# Patient Record
Sex: Male | Born: 1960
Health system: Southern US, Community
[De-identification: ages and names within clinical notes are randomized; demographics above are authoritative.]

## PROBLEM LIST (undated history)

## (undated) DIAGNOSIS — F329 Major depressive disorder, single episode, unspecified: Secondary | ICD-10-CM

## (undated) DIAGNOSIS — K219 Gastro-esophageal reflux disease without esophagitis: Secondary | ICD-10-CM

## (undated) DIAGNOSIS — Z9884 Bariatric surgery status: Secondary | ICD-10-CM

## (undated) DIAGNOSIS — R0602 Shortness of breath: Secondary | ICD-10-CM

## (undated) DIAGNOSIS — R7303 Prediabetes: Secondary | ICD-10-CM

## (undated) DIAGNOSIS — F32A Depression, unspecified: Secondary | ICD-10-CM

## (undated) DIAGNOSIS — M1990 Unspecified osteoarthritis, unspecified site: Secondary | ICD-10-CM

## (undated) DIAGNOSIS — F419 Anxiety disorder, unspecified: Secondary | ICD-10-CM

## (undated) DIAGNOSIS — M545 Low back pain, unspecified: Secondary | ICD-10-CM

## (undated) DIAGNOSIS — N189 Chronic kidney disease, unspecified: Secondary | ICD-10-CM

## (undated) DIAGNOSIS — E7800 Pure hypercholesterolemia, unspecified: Secondary | ICD-10-CM

## (undated) DIAGNOSIS — M2550 Pain in unspecified joint: Secondary | ICD-10-CM

## (undated) DIAGNOSIS — J189 Pneumonia, unspecified organism: Secondary | ICD-10-CM

## (undated) DIAGNOSIS — J45909 Unspecified asthma, uncomplicated: Secondary | ICD-10-CM

## (undated) DIAGNOSIS — M109 Gout, unspecified: Secondary | ICD-10-CM

## (undated) DIAGNOSIS — I517 Cardiomegaly: Secondary | ICD-10-CM

## (undated) DIAGNOSIS — M7989 Other specified soft tissue disorders: Secondary | ICD-10-CM

## (undated) DIAGNOSIS — G4730 Sleep apnea, unspecified: Secondary | ICD-10-CM

## (undated) DIAGNOSIS — Z22322 Carrier or suspected carrier of Methicillin resistant Staphylococcus aureus: Secondary | ICD-10-CM

## (undated) DIAGNOSIS — I10 Essential (primary) hypertension: Secondary | ICD-10-CM

## (undated) HISTORY — DX: Shortness of breath: R06.02

## (undated) HISTORY — DX: Major depressive disorder, single episode, unspecified: F32.9

## (undated) HISTORY — DX: Pain in unspecified joint: M25.50

## (undated) HISTORY — DX: Depression, unspecified: F32.A

## (undated) HISTORY — DX: Pure hypercholesterolemia, unspecified: E78.00

## (undated) HISTORY — DX: Anxiety disorder, unspecified: F41.9

## (undated) HISTORY — DX: Unspecified asthma, uncomplicated: J45.909

## (undated) HISTORY — DX: Other specified soft tissue disorders: M79.89

---

## 2006-12-12 HISTORY — PX: HERNIA REPAIR: SHX51

## 2007-06-25 ENCOUNTER — Ambulatory Visit (HOSPITAL_BASED_OUTPATIENT_CLINIC_OR_DEPARTMENT_OTHER): Admission: RE | Admit: 2007-06-25 | Discharge: 2007-06-25 | Payer: Self-pay | Admitting: General Surgery

## 2008-12-12 HISTORY — PX: GASTRIC BYPASS OPEN: SUR638

## 2011-04-26 NOTE — Op Note (Signed)
NAME:  WAI, LITT               ACCOUNT NO.:  [id number]   MEDICAL RECORD NO.:  [id number]          PATIENT TYPE:  AMB   LOCATION:  DSC                          FACILITY:  [facility]   PHYSICIAN:  Gabrielle Dare. Janee Morn, M.D.DATE OF BIRTH:  1961/11/29   DATE OF PROCEDURE:  06/25/2007  DATE OF DISCHARGE:                               OPERATIVE REPORT   PREOPERATIVE DIAGNOSIS:  Umbilical hernia.   POSTOPERATIVE DIAGNOSIS:  Umbilical hernia.   PROCEDURE:  Repair umbilical hernia with mesh.   SURGEON:  Violeta Gelinas, M.D.   ANESTHESIA:  General.   HISTORY OF PRESENT ILLNESS:  Mr. Straus is a 50 year old African  American gentleman who I evaluated in the office for a symptomatic  umbilical hernia.  He presents today for elective repair.   PROCEDURE IN DETAIL:  Informed consent was obtained.  The patient's site  was marked.  He received intravenous antibiotics.  He was brought to the  operating room.  General anesthesia was administered by the anesthesia  staff.  His abdomen was prepped and draped in sterile fashion.  Infraumbilical region was infiltrated with 0.25% Marcaine with  epinephrine.  A curvilinear infraumbilical incision was made.  Subcutaneous tissues were dissected down.  The umbilicus was  circumferentially dissected and the umbilical skin was lifted off the  underlying fascia and hernia sac without damaging the skin.  The hernia  sac contained only omentum.  It was circumferentially dissected away  from the fascia. It was reduced back into the abdomen without  difficulty.  The fascia was cleaned off circumferentially.  The hernia  repair was then completed with a oval polypropylene mesh.  This was  fashioned to allow greater than 1 cm circumferentially of overlay. The  defect itself was about 1 cm across.  This was sutured in with  interrupted 0-0 Prolene sutures in an inlay fashion.  Two more  additional 0-0 Prolenes were then placed between the fascia and the mesh  to  secure it.  This completed the repair.  The area was copiously  irrigated.  Some additional local anesthetic was injected.  The  umbilical skin was then tacked back down to the fascia with interrupted  2-0 Vicryl sutures.  Subcutaneous tissues were closed with running 3-0  Vicryl suture.  The skin was closed with running 4-0 Monocryl  subcuticular stitch after insuring excellent hemostasis.  Sponge, needle  and instrument counts were correct.  Benzoin, Steri-Strips and sterile  cotton ball and a sterile dressing were applied.  The patient tolerated  the procedure well without apparent complication, was taken to recovery  room in stable condition.      Gabrielle Dare Janee Morn, M.D.  Electronically Signed    BET/MEDQ  D:  06/25/2007  T:  06/26/2007  Job:  [id number]   cc:   Jethro Bastos, M.D.

## 2011-09-27 LAB — BASIC METABOLIC PANEL
BUN: 14
CO2: 28
Chloride: 101
Creatinine, Ser: 1.23
GFR calc Af Amer: >60
Potassium: 3.7

## 2011-09-27 LAB — POCT HEMOGLOBIN-HEMACUE
Hemoglobin: 14
Operator id: 112821

## 2012-05-12 HISTORY — PX: KNEE RECONSTRUCTION: SHX5883

## 2012-05-12 HISTORY — PX: ACHILLES TENDON SURGERY: SHX542

## 2013-08-15 ENCOUNTER — Encounter (HOSPITAL_BASED_OUTPATIENT_CLINIC_OR_DEPARTMENT_OTHER): Payer: Self-pay | Admitting: *Deleted

## 2013-08-15 NOTE — Progress Notes (Signed)
Pt had a hernia surgery here 08-has been working [employer] surgery there-had gastric bypass-lost 100lb-used to have sleep apnea-does still snore but no apnea To get ekg and bmet

## 2013-08-20 ENCOUNTER — Encounter (HOSPITAL_BASED_OUTPATIENT_CLINIC_OR_DEPARTMENT_OTHER)
Admission: RE | Admit: 2013-08-20 | Discharge: 2013-08-20 | Disposition: A | Discharge status: Home / Self Care | Payer: Worker's Compensation | Source: Ambulatory Visit | Attending: Orthopedic Surgery | Admitting: Orthopedic Surgery

## 2013-08-20 ENCOUNTER — Other Ambulatory Visit: Payer: Self-pay

## 2013-08-20 DIAGNOSIS — Z01812 Encounter for preprocedural laboratory examination: Secondary | ICD-10-CM | POA: Insufficient documentation

## 2013-08-20 DIAGNOSIS — Z01810 Encounter for preprocedural cardiovascular examination: Secondary | ICD-10-CM | POA: Insufficient documentation

## 2013-08-20 DIAGNOSIS — Z01818 Encounter for other preprocedural examination: Secondary | ICD-10-CM | POA: Insufficient documentation

## 2013-08-20 LAB — BASIC METABOLIC PANEL
CO2: 25 mEq/L (ref 19–32)
Chloride: 103 mEq/L (ref 96–112)
Creatinine, Ser: 1.15 mg/dL (ref 0.50–1.35)
GFR calc Af Amer: 85 mL/min — ABNORMAL LOW (ref >90)
Potassium: 3.8 mEq/L (ref 3.5–5.1)

## 2013-08-21 ENCOUNTER — Other Ambulatory Visit: Payer: Self-pay | Admitting: Orthopedic Surgery

## 2013-08-22 ENCOUNTER — Encounter (HOSPITAL_BASED_OUTPATIENT_CLINIC_OR_DEPARTMENT_OTHER): Payer: Self-pay | Admitting: Certified Registered Nurse Anesthetist

## 2013-08-22 ENCOUNTER — Ambulatory Visit (HOSPITAL_BASED_OUTPATIENT_CLINIC_OR_DEPARTMENT_OTHER): Payer: Worker's Compensation | Admitting: Certified Registered Nurse Anesthetist

## 2013-08-22 ENCOUNTER — Ambulatory Visit (HOSPITAL_BASED_OUTPATIENT_CLINIC_OR_DEPARTMENT_OTHER)
Admission: RE | Admit: 2013-08-22 | Discharge: 2013-08-23 | Disposition: A | Discharge status: Home / Self Care | Payer: Worker's Compensation | Source: Ambulatory Visit | Attending: Orthopedic Surgery | Admitting: Orthopedic Surgery

## 2013-08-22 ENCOUNTER — Ambulatory Visit (HOSPITAL_COMMUNITY): Payer: Worker's Compensation

## 2013-08-22 ENCOUNTER — Encounter (HOSPITAL_BASED_OUTPATIENT_CLINIC_OR_DEPARTMENT_OTHER)
Admission: RE | Disposition: A | Discharge status: Home / Self Care | Payer: Self-pay | Source: Ambulatory Visit | Attending: Orthopedic Surgery

## 2013-08-22 ENCOUNTER — Encounter (HOSPITAL_BASED_OUTPATIENT_CLINIC_OR_DEPARTMENT_OTHER): Payer: Self-pay | Admitting: *Deleted

## 2013-08-22 DIAGNOSIS — Z01818 Encounter for other preprocedural examination: Secondary | ICD-10-CM | POA: Insufficient documentation

## 2013-08-22 DIAGNOSIS — M25569 Pain in unspecified knee: Secondary | ICD-10-CM | POA: Insufficient documentation

## 2013-08-22 DIAGNOSIS — T8489XA Other specified complication of internal orthopedic prosthetic devices, implants and grafts, initial encounter: Secondary | ICD-10-CM | POA: Insufficient documentation

## 2013-08-22 DIAGNOSIS — M7661 Achilles tendinitis, right leg: Secondary | ICD-10-CM

## 2013-08-22 DIAGNOSIS — I10 Essential (primary) hypertension: Secondary | ICD-10-CM | POA: Insufficient documentation

## 2013-08-22 DIAGNOSIS — Z01812 Encounter for preprocedural laboratory examination: Secondary | ICD-10-CM | POA: Insufficient documentation

## 2013-08-22 DIAGNOSIS — K219 Gastro-esophageal reflux disease without esophagitis: Secondary | ICD-10-CM | POA: Insufficient documentation

## 2013-08-22 DIAGNOSIS — M6240 Contracture of muscle, unspecified site: Secondary | ICD-10-CM | POA: Insufficient documentation

## 2013-08-22 DIAGNOSIS — Z01810 Encounter for preprocedural cardiovascular examination: Secondary | ICD-10-CM | POA: Insufficient documentation

## 2013-08-22 DIAGNOSIS — M928 Other specified juvenile osteochondrosis: Secondary | ICD-10-CM | POA: Insufficient documentation

## 2013-08-22 DIAGNOSIS — M25579 Pain in unspecified ankle and joints of unspecified foot: Secondary | ICD-10-CM | POA: Insufficient documentation

## 2013-08-22 DIAGNOSIS — Y831 Surgical operation with implant of artificial internal device as the cause of abnormal reaction of the patient, or of later complication, without mention of misadventure at the time of the procedure: Secondary | ICD-10-CM | POA: Insufficient documentation

## 2013-08-22 DIAGNOSIS — S93499A Sprain of other ligament of unspecified ankle, initial encounter: Secondary | ICD-10-CM | POA: Insufficient documentation

## 2013-08-22 HISTORY — DX: Gastro-esophageal reflux disease without esophagitis: K21.9

## 2013-08-22 HISTORY — DX: Carrier or suspected carrier of methicillin resistant Staphylococcus aureus: Z22.322

## 2013-08-22 HISTORY — DX: Sleep apnea, unspecified: G47.30

## 2013-08-22 HISTORY — DX: Bariatric surgery status: Z98.84

## 2013-08-22 HISTORY — PX: EXCISION HAGLUND'S DEFORMITY WITH ACHILLES TENDON REPAIR: SHX5627

## 2013-08-22 HISTORY — DX: Essential (primary) hypertension: I10

## 2013-08-22 HISTORY — PX: HARDWARE REMOVAL: SHX979

## 2013-08-22 LAB — POCT HEMOGLOBIN-HEMACUE: Hemoglobin: 13.9 g/dL (ref 13.0–17.0)

## 2013-08-22 SURGERY — REMOVAL, HARDWARE
Anesthesia: Regional | Site: Knee | Laterality: Right | Wound class: Clean

## 2013-08-22 MED ORDER — OXYCODONE HCL 5 MG PO TABS
5.0000 mg | ORAL_TABLET | ORAL | Status: DC | PRN
  Administered 2013-08-22: 10 mg via ORAL
  Administered 2013-08-22: 5 mg via ORAL
  Administered 2013-08-22 – 2013-08-23 (×3): 15 mg via ORAL

## 2013-08-22 MED ORDER — MUPIROCIN CALCIUM 2 % EX CREA: 1.0000 "application " | TOPICAL_CREAM | Freq: Two times a day (BID) | CUTANEOUS | Status: DC

## 2013-08-22 MED ORDER — VANCOMYCIN HCL IN DEXTROSE 1-5 GM/200ML-% IV SOLN
1000.0000 mg | Freq: Two times a day (BID) | INTRAVENOUS | Status: AC
  Administered 2013-08-22: 1000 mg via INTRAVENOUS

## 2013-08-22 MED ORDER — MIDAZOLAM HCL 2 MG/2ML IJ SOLN
1.0000 mg | INTRAMUSCULAR | Status: DC | PRN
  Administered 2013-08-22: 2 mg via INTRAVENOUS
  Administered 2013-08-22: 1 mg via INTRAVENOUS

## 2013-08-22 MED ORDER — HYDROMORPHONE HCL PF 1 MG/ML IJ SOLN
0.2500 mg | INTRAMUSCULAR | Status: DC | PRN
  Administered 2013-08-22: 0.5 mg via INTRAVENOUS

## 2013-08-22 MED ORDER — FENTANYL CITRATE 0.05 MG/ML IJ SOLN
INTRAMUSCULAR | Status: DC | PRN
  Administered 2013-08-22: 50 ug via INTRAVENOUS
  Administered 2013-08-22: 25 ug via INTRAVENOUS
  Administered 2013-08-22: 50 ug via INTRAVENOUS

## 2013-08-22 MED ORDER — VANCOMYCIN HCL 10 G IV SOLR
1500.0000 mg | INTRAVENOUS | Status: AC
  Administered 2013-08-22: 1500 mg via INTRAVENOUS

## 2013-08-22 MED ORDER — BUPIVACAINE HCL (PF) 0.5 % IJ SOLN
INTRAMUSCULAR | Status: DC | PRN
  Administered 2013-08-22: 20 mL

## 2013-08-22 MED ORDER — AMLODIPINE BESYLATE 10 MG PO TABS: 10.0000 mg | ORAL_TABLET | Freq: Every day | ORAL | Status: DC

## 2013-08-22 MED ORDER — SENNOSIDES 8.6 MG PO TABS: 2.0000 | ORAL_TABLET | Freq: Every day | ORAL | Status: DC

## 2013-08-22 MED ORDER — CHLORHEXIDINE GLUCONATE 4 % EX LIQD: 60.0000 mL | Freq: Once | CUTANEOUS | Status: DC

## 2013-08-22 MED ORDER — ONDANSETRON HCL 4 MG PO TABS: 4.0000 mg | ORAL_TABLET | Freq: Four times a day (QID) | ORAL | Status: DC | PRN

## 2013-08-22 MED ORDER — SODIUM CHLORIDE 0.9 % IV SOLN
INTRAVENOUS | Status: DC
  Administered 2013-08-22: 150 mL/h via INTRAVENOUS
  Administered 2013-08-22: 20 mL/h via INTRAVENOUS

## 2013-08-22 MED ORDER — ENOXAPARIN SODIUM 40 MG/0.4ML ~~LOC~~ SOLN
40.0000 mg | SUBCUTANEOUS | Status: DC
  Administered 2013-08-23: 40 mg via SUBCUTANEOUS

## 2013-08-22 MED ORDER — BACITRACIN ZINC 500 UNIT/GM EX OINT
TOPICAL_OINTMENT | CUTANEOUS | Status: DC | PRN
  Administered 2013-08-22: 1 via TOPICAL

## 2013-08-22 MED ORDER — ONDANSETRON HCL 4 MG/2ML IJ SOLN
INTRAMUSCULAR | Status: DC | PRN
  Administered 2013-08-22: 4 mg via INTRAVENOUS

## 2013-08-22 MED ORDER — SUCCINYLCHOLINE CHLORIDE 20 MG/ML IJ SOLN
INTRAMUSCULAR | Status: DC | PRN
  Administered 2013-08-22: 100 mg via INTRAVENOUS

## 2013-08-22 MED ORDER — MIDAZOLAM HCL 5 MG/5ML IJ SOLN
INTRAMUSCULAR | Status: DC | PRN
  Administered 2013-08-22: 1 mg via INTRAVENOUS

## 2013-08-22 MED ORDER — DOCUSATE SODIUM 100 MG PO CAPS: 100.0000 mg | ORAL_CAPSULE | Freq: Two times a day (BID) | ORAL | Status: DC

## 2013-08-22 MED ORDER — SENNA 8.6 MG PO TABS: 2.0000 | ORAL_TABLET | Freq: Two times a day (BID) | ORAL | Status: DC

## 2013-08-22 MED ORDER — BUPIVACAINE-EPINEPHRINE 0.5% -1:200000 IJ SOLN
INTRAMUSCULAR | Status: DC | PRN
  Administered 2013-08-22: 10 mL

## 2013-08-22 MED ORDER — LACTATED RINGERS IV SOLN
INTRAVENOUS | Status: DC
  Administered 2013-08-22 (×2): via INTRAVENOUS

## 2013-08-22 MED ORDER — ASPIRIN EC 325 MG PO TBEC: 325.0000 mg | DELAYED_RELEASE_TABLET | Freq: Every day | ORAL | Status: DC

## 2013-08-22 MED ORDER — FENTANYL CITRATE 0.05 MG/ML IJ SOLN
50.0000 ug | INTRAMUSCULAR | Status: DC | PRN
  Administered 2013-08-22: 100 ug via INTRAVENOUS
  Administered 2013-08-22: 50 ug via INTRAVENOUS

## 2013-08-22 MED ORDER — OXYCODONE HCL 5 MG PO TABS: 5.0000 mg | ORAL_TABLET | ORAL | Status: DC | PRN

## 2013-08-22 MED ORDER — BUPIVACAINE-EPINEPHRINE PF 0.5-1:200000 % IJ SOLN
INTRAMUSCULAR | Status: DC | PRN
  Administered 2013-08-22: 30 mL

## 2013-08-22 MED ORDER — OXYCODONE HCL 5 MG PO TABS: 5.0000 mg | ORAL_TABLET | Freq: Once | ORAL | Status: DC | PRN

## 2013-08-22 MED ORDER — LIDOCAINE HCL (CARDIAC) 20 MG/ML IV SOLN
INTRAVENOUS | Status: DC | PRN
  Administered 2013-08-22: 30 mg via INTRAVENOUS

## 2013-08-22 MED ORDER — KETOROLAC TROMETHAMINE 15 MG/ML IJ SOLN
15.0000 mg | Freq: Once | INTRAMUSCULAR | Status: AC
  Administered 2013-08-22: 15 mg via INTRAVENOUS

## 2013-08-22 MED ORDER — PROPOFOL 10 MG/ML IV BOLUS
INTRAVENOUS | Status: DC | PRN
  Administered 2013-08-22: 260 mg via INTRAVENOUS

## 2013-08-22 MED ORDER — 0.9 % SODIUM CHLORIDE (POUR BTL) OPTIME
TOPICAL | Status: DC | PRN
  Administered 2013-08-22: 600 mL

## 2013-08-22 MED ORDER — SODIUM CHLORIDE 0.9 % IV SOLN: INTRAVENOUS | Status: DC

## 2013-08-22 MED ORDER — DEXAMETHASONE SODIUM PHOSPHATE 4 MG/ML IJ SOLN
INTRAMUSCULAR | Status: DC | PRN
  Administered 2013-08-22: 10 mg via INTRAVENOUS

## 2013-08-22 MED ORDER — EPHEDRINE SULFATE 50 MG/ML IJ SOLN
INTRAMUSCULAR | Status: DC | PRN
  Administered 2013-08-22: 10 mg via INTRAVENOUS

## 2013-08-22 MED ORDER — OXYCODONE HCL 5 MG/5ML PO SOLN: 5.0000 mg | Freq: Once | ORAL | Status: DC | PRN

## 2013-08-22 MED ORDER — HYDROMORPHONE HCL PF 1 MG/ML IJ SOLN
1.0000 mg | INTRAMUSCULAR | Status: DC | PRN
  Administered 2013-08-22 (×2): 1 mg via INTRAVENOUS

## 2013-08-22 MED ORDER — ONDANSETRON HCL 4 MG/2ML IJ SOLN: 4.0000 mg | Freq: Four times a day (QID) | INTRAMUSCULAR | Status: DC | PRN

## 2013-08-22 SURGICAL SUPPLY — 92 items
BAG DECANTER FOR FLEXI CONT (MISCELLANEOUS) IMPLANT
BANDAGE ELASTIC 4 VELCRO ST LF (GAUZE/BANDAGES/DRESSINGS) IMPLANT
BANDAGE ELASTIC 6 VELCRO ST LF (GAUZE/BANDAGES/DRESSINGS) ×3 IMPLANT
BANDAGE ESMARK 6X9 LF (GAUZE/BANDAGES/DRESSINGS) ×4 IMPLANT
BLADE AVERAGE 25X9 (BLADE) ×3 IMPLANT
BLADE SURG 15 STRL LF DISP TIS (BLADE) ×10 IMPLANT
BLADE SURG 15 STRL SS (BLADE) ×5
BNDG COHESIVE 4X5 TAN STRL (GAUZE/BANDAGES/DRESSINGS) ×6 IMPLANT
BNDG COHESIVE 6X5 TAN STRL LF (GAUZE/BANDAGES/DRESSINGS) ×3 IMPLANT
BNDG ESMARK 4X9 LF (GAUZE/BANDAGES/DRESSINGS) IMPLANT
BNDG ESMARK 6X9 LF (GAUZE/BANDAGES/DRESSINGS) ×6
CANISTER SUCTION 1200CC (MISCELLANEOUS) IMPLANT
CHLORAPREP W/TINT 26ML (MISCELLANEOUS) ×6 IMPLANT
CLOTH BEACON ORANGE TIMEOUT ST (SAFETY) ×3 IMPLANT
COVER TABLE BACK 60X90 (DRAPES) ×6 IMPLANT
CUFF TOURNIQUET SINGLE 34IN LL (TOURNIQUET CUFF) IMPLANT
CUFF TOURNIQUET SINGLE 44IN (TOURNIQUET CUFF) ×3 IMPLANT
DECANTER SPIKE VIAL GLASS SM (MISCELLANEOUS) IMPLANT
DRAPE C-ARM 42X72 X-RAY (DRAPES) ×3 IMPLANT
DRAPE C-ARMOR (DRAPES) ×3 IMPLANT
DRAPE EXTREMITY T 121X128X90 (DRAPE) ×6 IMPLANT
DRAPE OEC MINIVIEW 54X84 (DRAPES) ×6 IMPLANT
DRAPE SURG 17X23 STRL (DRAPES) IMPLANT
DRAPE U-SHAPE 47X51 STRL (DRAPES) ×6 IMPLANT
DRSG ADAPTIC 3X8 NADH LF (GAUZE/BANDAGES/DRESSINGS) ×3 IMPLANT
DRSG EMULSION OIL 3X3 NADH (GAUZE/BANDAGES/DRESSINGS) IMPLANT
DRSG PAD ABDOMINAL 8X10 ST (GAUZE/BANDAGES/DRESSINGS) ×9 IMPLANT
DRSG TEGADERM 4X4.75 (GAUZE/BANDAGES/DRESSINGS) IMPLANT
DURA STEPPER LG (CAST SUPPLIES) IMPLANT
DURA STEPPER MED (CAST SUPPLIES) IMPLANT
ELECT REM PT RETURN 9FT ADLT (ELECTROSURGICAL) ×3
ELECTRODE REM PT RTRN 9FT ADLT (ELECTROSURGICAL) ×2 IMPLANT
GLOVE BIO SURGEON STRL SZ8 (GLOVE) ×6 IMPLANT
GLOVE BIOGEL PI IND STRL 7.0 (GLOVE) ×4 IMPLANT
GLOVE BIOGEL PI IND STRL 7.5 (GLOVE) ×4 IMPLANT
GLOVE BIOGEL PI IND STRL 8 (GLOVE) ×4 IMPLANT
GLOVE BIOGEL PI INDICATOR 7.0 (GLOVE) ×2
GLOVE BIOGEL PI INDICATOR 7.5 (GLOVE) ×2
GLOVE BIOGEL PI INDICATOR 8 (GLOVE) ×2
GLOVE ECLIPSE 6.5 STRL STRAW (GLOVE) ×6 IMPLANT
GLOVE ECLIPSE 7.0 STRL STRAW (GLOVE) ×6 IMPLANT
GLOVE EXAM NITRILE MD LF STRL (GLOVE) ×3 IMPLANT
GOWN PREVENTION PLUS XLARGE (GOWN DISPOSABLE) ×9 IMPLANT
GOWN PREVENTION PLUS XXLARGE (GOWN DISPOSABLE) ×6 IMPLANT
KIT BIO-TENODESIS 3X8 DISP (MISCELLANEOUS)
KIT INSRT BABSR STRL DISP BTN (MISCELLANEOUS) IMPLANT
NDL SAFETY ECLIPSE 18X1.5 (NEEDLE) IMPLANT
NEEDLE HYPO 18GX1.5 SHARP (NEEDLE)
NEEDLE HYPO 22GX1.5 SAFETY (NEEDLE) IMPLANT
NEEDLE HYPO 25X1 1.5 SAFETY (NEEDLE) ×3 IMPLANT
NS IRRIG 1000ML POUR BTL (IV SOLUTION) ×3 IMPLANT
PACK ACHILLES SUTUREBRIDGE (Anchor) ×3 IMPLANT
PACK BASIN DAY SURGERY FS (CUSTOM PROCEDURE TRAY) ×6 IMPLANT
PAD CAST 4YDX4 CTTN HI CHSV (CAST SUPPLIES) ×4 IMPLANT
PADDING CAST ABS 4INX4YD NS (CAST SUPPLIES)
PADDING CAST ABS COTTON 4X4 ST (CAST SUPPLIES) IMPLANT
PADDING CAST COTTON 4X4 STRL (CAST SUPPLIES) ×2
PADDING CAST COTTON 6X4 STRL (CAST SUPPLIES) ×6 IMPLANT
PENCIL BUTTON HOLSTER BLD 10FT (ELECTRODE) ×6 IMPLANT
SANITIZER HAND PURELL 535ML FO (MISCELLANEOUS) ×3 IMPLANT
SHEET MEDIUM DRAPE 40X70 STRL (DRAPES) ×6 IMPLANT
SLEEVE SCD COMPRESS KNEE MED (MISCELLANEOUS) ×3 IMPLANT
SLEEVE SCD COMPRESS KNEE XLRG (MISCELLANEOUS) IMPLANT
SPLINT FAST PLASTER 5X30 (CAST SUPPLIES) ×20
SPLINT PLASTER CAST FAST 5X30 (CAST SUPPLIES) ×40 IMPLANT
SPONGE GAUZE 4X4 12PLY (GAUZE/BANDAGES/DRESSINGS) ×6 IMPLANT
SPONGE LAP 18X18 X RAY DECT (DISPOSABLE) ×6 IMPLANT
STAPLER VISISTAT 35W (STAPLE) IMPLANT
STOCKINETTE 6  STRL (DRAPES) ×2
STOCKINETTE 6 STRL (DRAPES) ×4 IMPLANT
STRIP CLOSURE SKIN 1/2X4 (GAUZE/BANDAGES/DRESSINGS) IMPLANT
SUCTION FRAZIER TIP 10 FR DISP (SUCTIONS) IMPLANT
SUT ETHIBOND 0 MO6 C/R (SUTURE) IMPLANT
SUT ETHIBOND 2 OS 4 DA (SUTURE) IMPLANT
SUT ETHIBOND 3-0 V-5 (SUTURE) IMPLANT
SUT ETHILON 3 0 PS 1 (SUTURE) ×9 IMPLANT
SUT FIBERWIRE #2 38 T-5 BLUE (SUTURE)
SUT MNCRL AB 3-0 PS2 18 (SUTURE) ×6 IMPLANT
SUT MNCRL AB 4-0 PS2 18 (SUTURE) IMPLANT
SUT VIC AB 0 CT1 27 (SUTURE) ×2
SUT VIC AB 0 CT1 27XBRD ANBCTR (SUTURE) ×4 IMPLANT
SUT VIC AB 2-0 SH 18 (SUTURE) IMPLANT
SUT VIC AB 2-0 SH 27 (SUTURE)
SUT VIC AB 2-0 SH 27XBRD (SUTURE) IMPLANT
SUT VICRYL 4-0 PS2 18IN ABS (SUTURE) IMPLANT
SUTURE FIBERWR #2 38 T-5 BLUE (SUTURE) IMPLANT
SYR BULB 3OZ (MISCELLANEOUS) ×6 IMPLANT
SYR CONTROL 10ML LL (SYRINGE) ×3 IMPLANT
TOWEL OR 17X24 6PK STRL BLUE (TOWEL DISPOSABLE) ×6 IMPLANT
TOWEL OR NON WOVEN STRL DISP B (DISPOSABLE) ×3 IMPLANT
TUBE CONNECTING 20X1/4 (TUBING) IMPLANT
UNDERPAD 30X30 INCONTINENT (UNDERPADS AND DIAPERS) ×6 IMPLANT

## 2013-08-22 NOTE — Brief Op Note (Signed)
08/22/2013  10:29 AM  PATIENT:  Michael Holland  52 y.o. male  PRE-OPERATIVE DIAGNOSIS: Left Knee and Right Foot Painful Hardware/Right Achilles Tendonitis and tight heelcord   POST-OPERATIVE DIAGNOSIS:  same  Procedure(s): 1.  Left lateral knee removal of deep implants 2.  Left medial knee removal of deep implant (separate incision) 3.  Right medial foot removal of deep implant 4.  Right gastrocnemius recession 5.  Right Achilles tendon debridement and reconstruction 6.  Excision of Haglund deformity 7.  2 view radiographs of the right foot  SURGEON:  Toni Arthurs, MD  ASSISTANT: n/a  ANESTHESIA:   General, regional  EBL:  minimal   TOURNIQUET:   Total Tourniquet Time Documented: Thigh (Left) - 56 minutes Total: Thigh (Left) - 56 minutes  Thigh (Right) - 74 minutes Total: Thigh (Right) - 74 minutes   COMPLICATIONS:  None apparent  DISPOSITION:  Extubated, awake and stable to recovery.   DICTATION ID:  [id number]

## 2013-08-22 NOTE — Anesthesia Preprocedure Evaluation (Addendum)
Anesthesia Evaluation  Patient identified by MRN, date of birth, ID band Patient awake    Reviewed: Allergy & Precautions, H&P , NPO status , Patient's Chart, lab work & pertinent test results  Airway Mallampati: III TM Distance: >3 FB Neck ROM: Full    Dental no notable dental hx. (+) Teeth Intact and Dental Advisory Given   Pulmonary sleep apnea ,  breath sounds clear to auscultation  Pulmonary exam normal       Cardiovascular hypertension, Pt. on medications Rhythm:Regular Rate:Normal     Neuro/Psych negative neurological ROS  negative psych ROS   GI/Hepatic Neg liver ROS, GERD-  Medicated,Gastric bypass   Endo/Other  Morbid obesity  Renal/GU negative Renal ROS  negative genitourinary   Musculoskeletal   Abdominal   Peds  Hematology negative hematology ROS (+)   Anesthesia Other Findings   Reproductive/Obstetrics negative OB ROS                          Anesthesia Physical Anesthesia Plan  ASA: III  Anesthesia Plan: General and Regional   Post-op Pain Management:    Induction: Intravenous  Airway Management Planned: Oral ETT  Additional Equipment:   Intra-op Plan:   Post-operative Plan: Extubation in OR  Informed Consent: I have reviewed the patients History and Physical, chart, labs and discussed the procedure including the risks, benefits and alternatives for the proposed anesthesia with the patient or authorized representative who has indicated his/her understanding and acceptance.   Dental advisory given  Plan Discussed with: CRNA  Anesthesia Plan Comments:         Anesthesia Quick Evaluation

## 2013-08-22 NOTE — Transfer of Care (Signed)
Immediate Anesthesia Transfer of Care Note  Patient: Michael Holland  Procedure(s) Performed: Procedure(s): LEFT KNEE PAINFUL HARDWARE REMOVAL (Left) RIGHT ACHILLES DEBRIDEMENT/GASTROC RECESSION/HAGLUND EXCISION RIGHT FOOT AND REMOVAL PAINFUL HARDWARE RIGHT FOOT  (Right)  Patient Location: PACU  Anesthesia Type:GA combined with regional for post-op pain  Level of Consciousness: awake, alert  and patient cooperative  Airway & Oxygen Therapy: Patient Spontanous Breathing and Patient connected to face mask oxygen  Post-op Assessment: Report given to PACU RN and Post -op Vital signs reviewed and stable  Post vital signs: Reviewed and stable  Complications: No apparent anesthesia complications

## 2013-08-22 NOTE — Progress Notes (Signed)
Assisted Dr. Fitzgerald with right, ultrasound guided, popliteal/saphenous block. Side rails up, monitors on throughout procedure. See vital signs in flow sheet. Tolerated Procedure well. 

## 2013-08-22 NOTE — H&P (Signed)
Michael Holland is an 52 y.o. male.   Chief Complaint:  Right foot and left knee pain HPI:  52 y/o male with h/o right calcaneus fracture and left tibial plateau fracture after a fall off a truck while working in [place].  He was treated surgically for both fractures in [place].  He returned home and continues to have pain from his hardware in both foot and knee.  He also has sxs of achilles tendonitis with a tight heelcord and large Haglund deformity.  Past Medical History  Diagnosis Date  . MRSA (methicillin resistant Staphylococcus aureus) colonization     nasal swab-had skin leasion years ago in [place]  . Hypertension   . H/O gastric bypass   . Sleep apnea     hx pre-gastric bp-lost 100lb  . GERD (gastroesophageal reflux disease)     occ    Past Surgical History  Procedure Laterality Date  . Knee reconstruction  6/13    [place]  . Achilles tendon surgery  6/13    fx foot  . Gastric bypass open  2010    [place]  . Hernia repair  2008    umb hernia-[facility]    History reviewed. No pertinent family history. Social History:  reports that he has never smoked. He does not have any smokeless tobacco history on file. He reports that  drinks alcohol. He reports that he does not use illicit drugs.  Allergies: No Known Allergies  Medications Prior to Admission  Medication Sig Dispense Refill  . amLODipine (NORVASC) 10 MG tablet Take 10 mg by mouth daily.      . chlorhexidine (HIBICLENS) 4 % external liquid Apply 60 mLs topically daily as needed.      . mupirocin cream (BACTROBAN) 2 % Apply 1 application topically 2 (two) times daily.        Results for orders placed during the hospital encounter of 08/22/13 (from the past 48 hour(s))  BASIC METABOLIC PANEL     Status: Abnormal   Collection Time    08/20/13  3:30 PM      Result Value Range   Sodium 139  135 - 145 mEq/L   Potassium 3.8  3.5 - 5.1 mEq/L   Chloride 103  96 - 112 mEq/L   CO2 25  19 -  32 mEq/L   Glucose, Bld 123 (*) 70 - 99 mg/dL   BUN 18  6 - 23 mg/dL   Creatinine, Ser 1.61  0.50 - 1.35 mg/dL   Calcium 9.4  8.4 - 09.6 mg/dL   GFR calc non Af Amer 73 (*) >90 mL/min   GFR calc Af Amer 85 (*) >90 mL/min   Comment: (NOTE)     The eGFR has been calculated using the CKD EPI equation.     This calculation has not been validated in all clinical situations.     eGFR's persistently <90 mL/min signify possible Chronic Kidney     Disease.   No results found.  ROS  No recent f/c/n/v/wt loss  Blood pressure 154/96, pulse 76, temperature 99.1 F (37.3 C), temperature source Oral, resp. rate 18, height 5\' 11"  (1.803 m), weight 144.868 kg (319 lb 6 oz), SpO2 99.00%. Physical Exam  wn wd male in nad.  A and O x 4.  Mood and affect normal.  EOMI.  Resp unlabored.  L knee with well healed surgical incisions and no signs of infection.  R foot TTp at insertion of achilles.  Tight heelcord.  Healed surgical incisions.  2+ dp and pt pulses.  Normal sens to LT around the foot and knee.  Assessment/Plan Painful hardware left knee and right foot - to OR for removal.  Right insertional achilles tendonopathy, Haglund deformity and tight heelcord - to oR for Haglund excision, Achilles debridement and gastroc recession.  The risks and benefits of the alternative treatment options have been discussed in detail.  The patient wishes to proceed with surgery and specifically understands risks of bleeding, infection, nerve damage, blood clots, need for additional surgery, amputation and death.   Toni Arthurs 2013-09-14, 7:15 AM

## 2013-08-22 NOTE — Anesthesia Postprocedure Evaluation (Signed)
  Anesthesia Post-op Note  Patient: Michael Holland  Procedure(s) Performed: Procedure(s): LEFT KNEE PAINFUL HARDWARE REMOVAL (Left) RIGHT ACHILLES DEBRIDEMENT/GASTROC RECESSION/HAGLUND EXCISION RIGHT FOOT AND REMOVAL PAINFUL HARDWARE RIGHT FOOT  (Right)  Patient Location: PACU  Anesthesia Type:GA combined with regional for post-op pain  Level of Consciousness: awake  Airway and Oxygen Therapy: Patient Spontanous Breathing  Post-op Pain: mild  Post-op Assessment: Post-op Vital signs reviewed, Patient's Cardiovascular Status Stable, Respiratory Function Stable, Patent Airway and No signs of Nausea or vomiting  Post-op Vital Signs: Reviewed and stable  Complications: No apparent anesthesia complications

## 2013-08-22 NOTE — Anesthesia Procedure Notes (Addendum)
Anesthesia Regional Block:  Popliteal block  Pre-Anesthetic Checklist: ,, timeout performed, Correct Patient, Correct Site, Correct Laterality, Correct Procedure, Correct Position, site marked, Risks and benefits discussed, pre-op evaluation, post-op pain management  Laterality: Right  Prep: Maximum Sterile Barrier Precautions used and chloraprep       Needles:  Injection technique: Single-shot  Needle Type: Echogenic Stimulator Needle      Needle Gauge: 21 and 21 G    Additional Needles:  Procedures: ultrasound guided (picture in chart) and nerve stimulator Popliteal block  Nerve Stimulator or Paresthesia:  Response: Peroneal,  Response: Tibial,   Additional Responses:   Narrative:  Start time: 08/22/2013 7:08 AM End time: 08/22/2013 7:23 AM Injection made incrementally with aspirations every 5 mL. Anesthesiologist: Sampson Goon, MD  Additional Notes: 2% Lidocaine skin wheel. Saphenous block with 15cc of 0.5% Bupivicaine plain.  Popliteal block Procedure Name: Intubation Date/Time: 08/22/2013 7:41 AM Performed by: Gable Odonohue D Pre-anesthesia Checklist: Patient identified, Emergency Drugs available, Suction available and Patient being monitored Patient Re-evaluated:Patient Re-evaluated prior to inductionOxygen Delivery Method: Circle System Utilized Preoxygenation: Pre-oxygenation with 100% oxygen Intubation Type: IV induction Ventilation: Mask ventilation without difficulty Grade View: Grade I Tube type: Oral Tube size: 7.0 mm Number of attempts: 1 Airway Equipment and Method: stylet and Video-laryngoscopy Placement Confirmation: ETT inserted through vocal cords under direct vision,  positive ETCO2 and breath sounds checked- equal and bilateral Secured at: 23 cm Tube secured with: Tape Dental Injury: Teeth and Oropharynx as per pre-operative assessment

## 2013-08-23 ENCOUNTER — Encounter (HOSPITAL_BASED_OUTPATIENT_CLINIC_OR_DEPARTMENT_OTHER): Payer: Self-pay | Admitting: Orthopedic Surgery

## 2013-08-23 NOTE — Op Note (Signed)
NAME:  Michael Holland, Michael Holland               ACCOUNT NO.:  [id number]  MEDICAL RECORD NO.:  [id number]  LOCATION:                                 FACILITY:  PHYSICIAN:  Toni Arthurs, MD             DATE OF BIRTH:  DATE OF PROCEDURE:  08/22/2013 DATE OF DISCHARGE:                              OPERATIVE REPORT   PREOPERATIVE DIAGNOSES: 1. Left knee painful hardware medially and laterally. 2. Right calcaneus painful hardware medially and laterally. 3. Right Achilles insertional tendinopathy. 4. Right Haglund deformity. 5. Right tight heel cord.  POSTOPERATIVE DIAGNOSES: 1. Left knee painful hardware medially and laterally. 2. Right calcaneus painful hardware medially and laterally. 3. Right Achilles insertional tendinopathy. 4. Right Haglund deformity. 5. Right tight heel cord.  PROCEDURES: 1. Left lateral knee removal of deep implants. 2. Left medial knee removal of deep implants (separate incision). 3. Right medial foot removal of deep implant. 4. Right lateral foot removal of deep implant. 5. Right gastrocnemius recession. 6. Right Achilles tendon debridement and reconstruction. 7. Excision of right foot Haglund deformity. 8. Two-view radiographs of the right foot.  SURGEON:  Toni Arthurs, MD  ANESTHESIA:  General, regional.  ESTIMATED BLOOD LOSS:  Minimal.  TOURNIQUET TIME:  56 minutes at the left thigh and 74 minutes at the right thigh.  COMPLICATIONS:  None apparent.  DISPOSITION:  Extubated, awake, and stable to recovery.  INDICATIONS FOR PROCEDURE:  The patient is a 52 year old male with a past medical history significant for a work injury when he fell from a truck fracturing his right calcaneus and left tibial plateau.  He underwent open reduction and internal fixation of both of these injuries in [place].  He continues to have pain at the left knee and right foot due to retained hardware.  He also has symptoms of Achilles insertional tendinopathy and a tight  heel cord with a prominent Haglund deformity on the right.  He presents now for operative treatment of these conditions having failed nonoperative treatment to date.  He understands the risks and benefits, the alternative treatment options and elects surgical treatment.  He specifically understands risks of bleeding, infection, nerve damage, blood clots, need for additional surgery, amputation, and death.  PROCEDURE IN DETAIL:  After preoperative consent was obtained and the correct operative site was identified, the patient was brought to the operating room and placed supine on the operating table.  General anesthesia was induced.  Preoperative antibiotics were administered. Surgical time-out was taken.  The left lower extremity was prepped and draped in standard sterile fashion with the tourniquet around the thigh. The extremity was exsanguinated and tourniquet was inflated to 250 mmHg. Patient's previous lateral incision at the knee was identified.  It was made and extended distally.  Sharp dissection was carried down through the skin and subcutaneous tissue.  The screw heads were all grown over with bone.  A curette was used to remove the bone from the screw head. Two screws were removed, approximately at the level of the joint line and one distally at the metaphyseal area of the distal tibia.  The wound was irrigated copiously.  AP and  lateral radiographic images helped identify the location of the screws and confirmed appropriate removal. The lateral incision was closed with 0 Vicryl simple sutures at the level of the periosteum.  The subcutaneous tissue was approximated with inverted simple sutures of 3-0 Monocryl and a horizontal mattress sutures of 3-0 nylon were used to close the skin incision.  Attention was then turned to the medial aspect of the knee where the screw head was identified with a needle on AP and lateral fluoroscopic images.  A stab incision was made at the  location of the previous incision.  Sharp dissection was carried down through the skin and subcutaneous tissue.  A large curette was used to remove the overgrown bone from the head of the screw.  A smaller curette was used to remove soft tissue from the screw head.  Screwdriver was then used to remove the screw in its entirety.  Final AP and lateral radiographs of the knee showed complete removal of all hardware.  The wound was irrigated copiously.  Horizontal mattress suture of 3-0 nylon was used to close the skin incision.  Sterile dressings were applied followed by compression wrap.  The tourniquet was released.  The patient was then turned into the prone position on the second operating table with all bony prominences padded well.  The right lower extremity was then prepped and draped in standard sterile fashion with tourniquet around the thigh.  The extremity had been exsanguinated prior to turning into the prone position.  A longitudinal incision was then made at the insertion of the gastrocnemius tendon.  Sharp dissection was carried down through the skin and subcutaneous tissue taking care to protect the sural nerve and lesser saphenous vein.  The gastrocnemius tendon was identified.  It was divided under direct vision in its entirety.  Wound was irrigated.  Inverted simple sutures of 3-0 Monocryl were used to close subcutaneous tissue and a running 3-0 nylon was used to close the skin incision.  Attention was then turned to the posterior aspect of the heel.  A longitudinal incision was made and sharp dissection was carried down through the skin and subcutaneous tissue and peritenon developing a full- thickness flap medially and laterally.  The Achilles tendon was then split in line with its fibers in the midline.  It was released from its insertion distally on the calcaneus.  The patient was noted to have a large insertional enthesophyte as well as a prominent Haglund  deformity. An oscillating saw was used to resect both of these in their entirety and the contours of the cut were smoothed with a saw and a rasp.  At this point, an elevator was used to identify the medial screw using AP and lateral fluoroscopic images.  The screw was removed in its entirety after clearing the head of soft tissues.  Attention was then turned to the lateral aspect of the calcaneus where the second screw was identified again on AP and lateral fluoroscopic images.  The head was cleared of all soft tissues and the screw was removed in its entirety.  The wound was then irrigated copiously.  The Achilles tendon was sharply debrided of all degenerated tendon fibers. The Arthrex suture bridge construct was then used to repair the Achilles tendon back to its insertion at the posterior aspect of the calcaneus. The longitudinal split was then repaired with horizontal mattress sutures of 0 Vicryl.  The paratenon subcutaneous tissue was closed over the tendon with inverted simple sutures of 3-0 Monocryl and  running 3-0 nylon was used to close the skin incision.  Final AP and lateral fluoroscopic images confirmed appropriate removal of all hardware from the calcaneus and the appropriate excision of the Haglund deformity and insertional enthesophyte.  Sterile dressings were applied followed by a well-padded short-leg splint.  The patient was then awakened from anesthesia and transported to recovery room in stable condition.  The tourniquet had been released after application of the dressings at 74 minutes.  FOLLOWUP PLAN:  The patient will be nonweightbearing on the right lower extremity.  He will follow up with me in 2 weeks for suture removal and conversion to a cast.  He will likely be observed overnight for pain control and due to history of sleep apnea.  RADIOGRAPHS:  AP and lateral radiographs of the calcaneus were obtained showing interval removal of the 2 screws as well as  removal of the Haglund deformity and insertional enthesophyte.  No fracture dislocation was noted.     Toni Arthurs, MD     JH/MEDQ  D:  08/22/2013  T:  08/22/2013  Job:  [id number]

## 2014-10-28 ENCOUNTER — Other Ambulatory Visit: Payer: Self-pay | Admitting: Specialist

## 2014-10-28 DIAGNOSIS — M5126 Other intervertebral disc displacement, lumbar region: Secondary | ICD-10-CM

## 2014-11-10 ENCOUNTER — Other Ambulatory Visit: Payer: Self-pay

## 2014-11-10 ENCOUNTER — Inpatient Hospital Stay
Admission: RE | Admit: 2014-11-10 | Discharge: 2014-11-10 | Disposition: A | Discharge status: Home / Self Care | Payer: Self-pay | Source: Ambulatory Visit | Attending: Specialist | Admitting: Specialist

## 2014-11-10 ENCOUNTER — Ambulatory Visit
Admission: RE | Admit: 2014-11-10 | Discharge: 2014-11-10 | Disposition: A | Discharge status: Home / Self Care | Payer: Worker's Compensation | Source: Ambulatory Visit | Attending: Specialist | Admitting: Specialist

## 2014-11-10 ENCOUNTER — Other Ambulatory Visit: Payer: Self-pay | Admitting: Specialist

## 2014-11-10 DIAGNOSIS — M5126 Other intervertebral disc displacement, lumbar region: Secondary | ICD-10-CM

## 2014-11-10 MED ORDER — OXYCODONE-ACETAMINOPHEN 5-325 MG PO TABS
1.0000 | ORAL_TABLET | Freq: Once | ORAL | Status: AC
  Administered 2014-11-10: 1 via ORAL

## 2014-11-10 MED ORDER — IOHEXOL 180 MG/ML  SOLN
15.0000 mL | Freq: Once | INTRAMUSCULAR | Status: AC | PRN
  Administered 2014-11-10: 15 mL via INTRATHECAL

## 2014-11-10 MED ORDER — DIAZEPAM 5 MG PO TABS
10.0000 mg | ORAL_TABLET | Freq: Once | ORAL | Status: AC
  Administered 2014-11-10: 10 mg via ORAL

## 2014-11-10 NOTE — Discharge Instructions (Signed)

## 2015-11-16 ENCOUNTER — Ambulatory Visit: Payer: Self-pay | Admitting: Skilled Nursing Facility1

## 2015-12-24 ENCOUNTER — Encounter: Payer: Worker's Compensation | Attending: Specialist | Admitting: Skilled Nursing Facility1

## 2015-12-24 ENCOUNTER — Encounter: Payer: Self-pay | Admitting: Skilled Nursing Facility1

## 2015-12-24 VITALS — Ht 71.0 in | Wt 340.0 lb

## 2015-12-24 DIAGNOSIS — Z713 Dietary counseling and surveillance: Secondary | ICD-10-CM | POA: Insufficient documentation

## 2015-12-24 DIAGNOSIS — Z6841 Body Mass Index (BMI) 40.0 and over, adult: Secondary | ICD-10-CM | POA: Insufficient documentation

## 2015-12-24 DIAGNOSIS — E669 Obesity, unspecified: Secondary | ICD-10-CM

## 2015-12-24 NOTE — Progress Notes (Signed)
  Medical Nutrition Therapy:  Appt start time: 1400 end time:  1500.   Assessment:  Primary concerns today: referred for obesity. Pt states he fell off the back of a truck which [place]. Pt states he would like to lose weight in order to qualify for back surgery. Pt states in 2011 he was 400 pounds and then lost wt to 250 pounds after getting the sleeve in [place].Pt states when he fell he was 250 pounds but due to decreased activity he gained wt. Pt states he is up and down in the middle of the night due to pain spasms in his back, knee, or foot. Pt states he eats in front of the television. Pt states he has not been doing his water workouts due to back spasms. Pt states a long car ride may have been what aggravated the back pain. Pt states he is ready to get back to his water workouts. Pt states he is angry and upset that he cannot work and spends his day not doing anything because he is so depressed about his situation.  Preferred Learning Style:   No preference indicated   Learning Readiness:   Ready  MEDICATIONS: Pain medication   DIETARY INTAKE:  Usual eating pattern includes 3 meals and 3 snacks per day.  Everyday foods include none stated.  Avoided foods include none stated.    24-hr recall:  B ( AM): cereal Snk ( AM): cookies----trail mix L ( PM): fish and rice Snk ( PM): chips---nuts D ( PM): [employer] pizza Snk ( PM): none----wake up and eat ice cream and cookies Beverages: water  Usual physical activity: ADL's  Estimated energy needs: 2000 calories 225 g carbohydrates 150 g protein 56 g fat  Progress Towards Goal(s):  In progress.   Nutritional Diagnosis:  [place]-3.3 Overweight/obesity As related to overconsumption calorically dense foods.  As evidenced by pt report, 24 hr recall, and BMI 47.42.    Intervention:  Nutrition counseling for obesity. Dietitian educated the pt on balanced meals, not eating once bedtime has come, and  being physically active when he can. Goals: -Start a new hobby: tinkering with Editor, commissioningelectric cars/helicopter, try painting, and puzzles -A meal: carbohydrate, protein, vegetables; having more vegetables than the other 2 categories -3 meals a day and 2-3 snacks  Teaching Method Utilized:  Visual Auditory  Handouts given during visit include:  Snack sheet  Arm chair workouts  Barriers to learning/adherence to lifestyle change: depressed mood  Demonstrated degree of understanding via:  Teach Back   Monitoring/Evaluation:  Dietary intake, exercise, and body weight prn.

## 2015-12-24 NOTE — Patient Instructions (Signed)
-  Start a new hobby: tinkering with Editor, commissioningelectric cars/helicopter, try painting, and puzzles -A meal: carbohydrate, protein, vegetables; having more vegetables than the other 2 categories -3 meals a day and 2-3 snacks

## 2016-01-11 ENCOUNTER — Encounter: Payer: Self-pay | Admitting: Specialist

## 2016-07-26 ENCOUNTER — Other Ambulatory Visit: Payer: Self-pay | Admitting: Specialist

## 2016-07-26 DIAGNOSIS — M48061 Spinal stenosis, lumbar region without neurogenic claudication: Secondary | ICD-10-CM

## 2016-08-02 ENCOUNTER — Ambulatory Visit
Admission: RE | Admit: 2016-08-02 | Discharge: 2016-08-02 | Disposition: A | Discharge status: Home / Self Care | Payer: Worker's Compensation | Source: Ambulatory Visit | Attending: Specialist | Admitting: Specialist

## 2016-08-02 DIAGNOSIS — M48061 Spinal stenosis, lumbar region without neurogenic claudication: Secondary | ICD-10-CM

## 2016-08-02 MED ORDER — MEPERIDINE HCL 100 MG/ML IJ SOLN
100.0000 mg | Freq: Once | INTRAMUSCULAR | Status: AC
  Administered 2016-08-02: 100 mg via INTRAMUSCULAR

## 2016-08-02 MED ORDER — IOPAMIDOL (ISOVUE-M 200) INJECTION 41%
15.0000 mL | Freq: Once | INTRAMUSCULAR | Status: AC
  Administered 2016-08-02: 15 mL via INTRATHECAL

## 2016-08-02 MED ORDER — DIAZEPAM 5 MG PO TABS
10.0000 mg | ORAL_TABLET | Freq: Once | ORAL | Status: AC
  Administered 2016-08-02: 10 mg via ORAL

## 2016-08-02 MED ORDER — ONDANSETRON HCL 4 MG/2ML IJ SOLN
4.0000 mg | Freq: Once | INTRAMUSCULAR | Status: AC
  Administered 2016-08-02: 4 mg via INTRAMUSCULAR

## 2016-08-02 NOTE — Discharge Instructions (Signed)

## 2016-08-04 ENCOUNTER — Telehealth: Payer: Self-pay | Admitting: Radiology

## 2016-08-04 NOTE — Telephone Encounter (Signed)
I called patient to see how he is doing after his myelogram here 08/02/16.  He states he's just fine; no headache.  Donell SievertJeanne Lohr, RN

## 2016-12-21 ENCOUNTER — Ambulatory Visit: Payer: Self-pay | Admitting: Orthopedic Surgery

## 2016-12-21 MED ORDER — VANCOMYCIN HCL 10 G IV SOLR: 1500.0000 mg | INTRAVENOUS | Status: AC

## 2017-01-20 NOTE — Patient Instructions (Signed)
Michael Holland  01/20/2017   Your procedure is scheduled on: 02/02/2017    Report to [facility] Main  Entrance take [place]  elevators to 3rd floor to  Short Stay Center at     0715 AM.  Call this number if you have problems the morning of surgery [phone]   Remember: ONLY 1 PERSON MAY GO WITH YOU TO SHORT STAY TO GET  READY MORNING OF YOUR SURGERY.  Do not eat food or drink liquids :After Midnight.     Take these medicines the morning of surgery with A SIP OF WATER: Oxycodone if needed                                 You may not have any metal on your body including hair pins and              piercings  Do not wear jewelry,  lotions, powders or perfumes, deodorant                           Men may shave face and neck.   Do not bring valuables to the hospital. [place] IS NOT             RESPONSIBLE   FOR VALUABLES.  Contacts, dentures or bridgework may not be worn into surgery.  Leave suitcase in the car. After surgery it may be brought to your room.                     Please read over the following fact sheets you were given: _____________________________________________________________________             [facility] - Preparing for Surgery Before surgery, you can play an important role.  Because skin is not sterile, your skin needs to be as free of germs as possible.  You can reduce the number of germs on your skin by washing with CHG (chlorahexidine gluconate) soap before surgery.  CHG is an antiseptic cleaner which kills germs and bonds with the skin to continue killing germs even after washing. Please DO NOT use if you have an allergy to CHG or antibacterial soaps.  If your skin becomes reddened/irritated stop using the CHG and inform your nurse when you arrive at Short Stay. Do not shave (including legs and underarms) for at least 48 hours prior to the first CHG shower.  You may shave your face/neck. Please follow these instructions  carefully:  1.  Shower with CHG Soap the night before surgery and the  morning of Surgery.  2.  If you choose to wash your hair, wash your hair first as usual with your  normal  shampoo.  3.  After you shampoo, rinse your hair and body thoroughly to remove the  shampoo.                           4.  Use CHG as you would any other liquid soap.  You can apply chg directly  to the skin and wash                       Gently with a scrungie or clean washcloth.  5.  Apply the CHG Soap to your  body ONLY FROM THE NECK DOWN.   Do not use on face/ open                           Wound or open sores. Avoid contact with eyes, ears mouth and genitals (private parts).                       Wash face,  Genitals (private parts) with your normal soap.             6.  Wash thoroughly, paying special attention to the area where your surgery  will be performed.  7.  Thoroughly rinse your body with warm water from the neck down.  8.  DO NOT shower/wash with your normal soap after using and rinsing off  the CHG Soap.                9.  Pat yourself dry with a clean towel.            10.  Wear clean pajamas.            11.  Place clean sheets on your bed the night of your first shower and do not  sleep with pets. Day of Surgery : Do not apply any lotions/deodorants the morning of surgery.  Please wear clean clothes to the hospital/surgery center.  FAILURE TO FOLLOW THESE INSTRUCTIONS MAY RESULT IN THE CANCELLATION OF YOUR SURGERY PATIENT SIGNATURE_________________________________  NURSE SIGNATURE__________________________________  ________________________________________________________________________   Rogelia MireIncentive Spirometer  An incentive spirometer is a tool that can help keep your lungs clear and active. This tool measures how well you are filling your lungs with each breath. Taking long deep breaths may help reverse or decrease the chance of developing breathing (pulmonary) problems (especially infection)  following:  A long period of time when you are unable to move or be active. BEFORE THE PROCEDURE   If the spirometer includes an indicator to show your best effort, your nurse or respiratory therapist will set it to a desired goal.  If possible, sit up straight or lean slightly forward. Try not to slouch.  Hold the incentive spirometer in an upright position. INSTRUCTIONS FOR USE  1. Sit on the edge of your bed if possible, or sit up as far as you can in bed or on a chair. 2. Hold the incentive spirometer in an upright position. 3. Breathe out normally. 4. Place the mouthpiece in your mouth and seal your lips tightly around it. 5. Breathe in slowly and as deeply as possible, raising the piston or the ball toward the top of the column. 6. Hold your breath for 3-5 seconds or for as long as possible. Allow the piston or ball to fall to the bottom of the column. 7. Remove the mouthpiece from your mouth and breathe out normally. 8. Rest for a few seconds and repeat Steps 1 through 7 at least 10 times every 1-2 hours when you are awake. Take your time and take a few normal breaths between deep breaths. 9. The spirometer may include an indicator to show your best effort. Use the indicator as a goal to work toward during each repetition. 10. After each set of 10 deep breaths, practice coughing to be sure your lungs are clear. If you have an incision (the cut made at the time of surgery), support your incision when coughing by placing a pillow or rolled up towels firmly against it.  Once you are able to get out of bed, walk around indoors and cough well. You may stop using the incentive spirometer when instructed by your caregiver.  RISKS AND COMPLICATIONS  Take your time so you do not get dizzy or light-headed.  If you are in pain, you may need to take or ask for pain medication before doing incentive spirometry. It is harder to take a deep breath if you are having pain. AFTER USE  Rest and  breathe slowly and easily.  It can be helpful to keep track of a log of your progress. Your caregiver can provide you with a simple table to help with this. If you are using the spirometer at home, follow these instructions: SEEK MEDICAL CARE IF:   You are having difficultly using the spirometer.  You have trouble using the spirometer as often as instructed.  Your pain medication is not giving enough relief while using the spirometer.  You develop fever of 100.5 F (38.1 C) or higher. SEEK IMMEDIATE MEDICAL CARE IF:   You cough up bloody sputum that had not been present before.  You develop fever of 102 F (38.9 C) or greater.  You develop worsening pain at or near the incision site. MAKE SURE YOU:   Understand these instructions.  Will watch your condition.  Will get help right away if you are not doing well or get worse. Document Released: 04/10/2007 Document Revised: 02/20/2012 Document Reviewed: 06/11/2007 [facility] Patient Information 2014 [place], [place].   ________________________________________________________________________

## 2017-01-23 ENCOUNTER — Ambulatory Visit (HOSPITAL_COMMUNITY)
Admission: RE | Admit: 2017-01-23 | Discharge: 2017-01-23 | Disposition: A | Discharge status: Home / Self Care | Payer: Self-pay | Source: Ambulatory Visit | Attending: Orthopedic Surgery | Admitting: Orthopedic Surgery

## 2017-01-23 ENCOUNTER — Encounter (HOSPITAL_COMMUNITY)
Admission: RE | Admit: 2017-01-23 | Discharge: 2017-01-23 | Disposition: A | Discharge status: Home / Self Care | Payer: Worker's Compensation | Source: Ambulatory Visit | Attending: Specialist | Admitting: Specialist

## 2017-01-23 ENCOUNTER — Encounter (HOSPITAL_COMMUNITY): Payer: Self-pay

## 2017-01-23 DIAGNOSIS — Z01810 Encounter for preprocedural cardiovascular examination: Secondary | ICD-10-CM | POA: Diagnosis not present

## 2017-01-23 DIAGNOSIS — M48061 Spinal stenosis, lumbar region without neurogenic claudication: Secondary | ICD-10-CM | POA: Diagnosis not present

## 2017-01-23 DIAGNOSIS — M47897 Other spondylosis, lumbosacral region: Secondary | ICD-10-CM | POA: Insufficient documentation

## 2017-01-23 DIAGNOSIS — Z01812 Encounter for preprocedural laboratory examination: Secondary | ICD-10-CM | POA: Insufficient documentation

## 2017-01-23 DIAGNOSIS — M5126 Other intervertebral disc displacement, lumbar region: Secondary | ICD-10-CM

## 2017-01-23 HISTORY — DX: Gout, unspecified: M10.9

## 2017-01-23 LAB — CBC
HEMATOCRIT: 40.8 % (ref 39.0–52.0)
HEMOGLOBIN: 13.9 g/dL (ref 13.0–17.0)
MCH: 28.4 pg (ref 26.0–34.0)
MCHC: 34.1 g/dL (ref 30.0–36.0)
MCV: 83.4 fL (ref 78.0–100.0)
Platelets: 250 10*3/uL (ref 150–400)
RBC: 4.89 MIL/uL (ref 4.22–5.81)
RDW: 14.2 % (ref 11.5–15.5)
WBC: 5.7 10*3/uL (ref 4.0–10.5)

## 2017-01-23 LAB — BASIC METABOLIC PANEL
ANION GAP: 4 — AB (ref 5–15)
BUN: 21 mg/dL — ABNORMAL HIGH (ref 6–20)
CO2: 28 mmol/L (ref 22–32)
Calcium: 9.3 mg/dL (ref 8.9–10.3)
Chloride: 107 mmol/L (ref 101–111)
Creatinine, Ser: 1.57 mg/dL — ABNORMAL HIGH (ref 0.61–1.24)
GFR calc Af Amer: 56 mL/min — ABNORMAL LOW (ref >60)
GFR, EST NON AFRICAN AMERICAN: 49 mL/min — AB (ref >60)
GLUCOSE: 98 mg/dL (ref 65–99)
POTASSIUM: 4.3 mmol/L (ref 3.5–5.1)
Sodium: 139 mmol/L (ref 135–145)

## 2017-01-23 LAB — SURGICAL PCR SCREEN
MRSA, PCR: NEGATIVE
Staphylococcus aureus: NEGATIVE

## 2017-01-23 NOTE — Progress Notes (Signed)
   01/23/17 0901  OBSTRUCTIVE SLEEP APNEA  Have you ever been diagnosed with sleep apnea through a sleep study? Yes (lost 80 libs-never used CPAP but was diagnosed with Sleep Apnea)  If yes, do you have and use a CPAP or BPAP machine every night? 1  Do you snore loudly (loud enough to be heard through closed doors)?  1  Do you often feel tired, fatigued, or sleepy during the daytime (such as falling asleep during driving or talking to someone)? 1  Has anyone observed you stop breathing during your sleep? 0  Do you have, or are you being treated for high blood pressure? 1  BMI more than 35 kg/m2? 1  Age > 50 (1-yes) 1  Neck circumference greater than:Male 16 inches or larger, Male 17inches or larger? 1  Male Gender (Yes=1) 1  Obstructive Sleep Apnea Score 7  Score 5 or greater  Results sent to PCP

## 2017-01-24 ENCOUNTER — Ambulatory Visit: Payer: Self-pay | Admitting: Orthopedic Surgery

## 2017-01-24 NOTE — H&P (Signed)
Michael Holland DOB: 11-Jan-1961 Married / Language: [place] / Race: Black or African American Male  H&P Date: 01/24/17  Chief complaint: back and B/L leg pain  History of Present Illness  The patient is a 56 year old male who comes in today for a preoperative History and Physical. The patient is scheduled for a lumbar decompression to be performed by Dr. Javier Docker, MD at [facility] on 02/02/17. He is now 4 years, 4 1/2 months out from injury (DOI 05/15/12) at work. Reports ongoing chronic pain in the knee as well as in the back and into the legs. Has been taking Percocet for pain with some relief and is due for a refill. Reports pain with standing, feels he cannot stand up straight without pain in the back and legs. Has trouble walking. Has worked on losing weight. He does not have a PCP. Recently did get personal health insurance. Has previously been dx with HTN. Reports only occasional HA none currently, no visual disturbances. He was previously on Norvasc 5mg  but did not like taking prescription meds for his BP. He stopped that and has been recently taking Plavinol which is a natural remedy for BP. States he just realized he is taking Plavinol at half the dose he is supposed to. His [facility] pre-op appt was yesterday and he was hypertensive there: 191/96 and 171/109.  Problem List/Past Medical Hx Pain of right heel (M79.671)  Aftercare following surgery of the musculoskeletal system (Z47.89)  S/P left knee arthroscopy (Z61.096)  Post-traumatic osteoarthritis of left knee (M17.32)  Tear of medial meniscus of left knee, subsequent encounter (E45.409W)  Primary osteoarthritis of both knees (M17.0)  Painful orthopaedic hardware (T84.84XA)  Chronic pain of left knee (M25.562)  Lumbar spine pain (M54.5)  Lumbar disc displacement (M51.26)  Spinal stenosis of lumbar region with neurogenic claudication (J19.147)  Lumbosacral DDD (M51.37)  Traumatic arthritis of left knee  (W29.562)  Severe obesity (BMI >= 40) (E66.01)  Arthritis of knee (M17.10)  Plantar fasciitis, right (M72.2)  Hypertension  Allergies No Known Drug Allergies [07/05/2013]:   Family History First Degree Relatives  reported Hypertension  First Degree Relatives. mother and brother  Social History  Tobacco use  Never smoker. never smoker Never smoker  Exercise  Exercises never; does running / walking Alcohol use  current drinker; drinks beer and wine; less than 5 per week Drug/Alcohol Rehab (Previously)  no Children  3 Pain Contract  no Marital status  married Current work status  working full time Living situation  live with spouse Drug/Alcohol Rehab (Currently)  no Illicit drug use  no Number of flights of stairs before winded  greater than 5  Medication History Percocet (5-325MG  Tablet, Oral) Active. Plavinol plus  Past Surgical History No pertinent past surgical history   Review of Systems  General Not Present- Chills, Fatigue, Fever, Memory Loss, Night Sweats, Weight Gain and Weight Loss. Skin Not Present- Eczema, Hives, Itching, Lesions and Rash. HEENT Not Present- Dentures, Double Vision, Headache, Hearing Loss, Tinnitus and Visual Loss. Respiratory Not Present- Allergies, Chronic Cough, Coughing up blood, Shortness of breath at rest and Shortness of breath with exertion. Cardiovascular Not Present- Chest Pain, Difficulty Breathing Lying Down, Murmur, Palpitations, Racing/skipping heartbeats and Swelling. Gastrointestinal Not Present- Abdominal Pain, Bloody Stool, Constipation, Diarrhea, Difficulty Swallowing, Heartburn, Jaundice, Loss of appetitie, Nausea and Vomiting. Male Genitourinary Not Present- Blood in Urine, Discharge, Flank Pain, Incontinence, Painful Urination, Urgency, Urinary frequency, Urinary Retention, Urinating at Night and Weak urinary stream.  Musculoskeletal Present- Back Pain, Joint Pain, Joint Swelling, Muscle Pain and Muscle  Weakness. Not Present- Morning Stiffness and Spasms. Neurological Not Present- Blackout spells, Difficulty with balance, Dizziness, Paralysis, Tremor and Weakness. Psychiatric Not Present- Insomnia.  Vitals 01/24/2017 2:08 PM BP: 200/110 (Sitting, Right Arm, Standard) Repeat BP taken with manual cuff at appt today, 220/130 JMB Weight: 345.5 lb Height: 70in Body Surface Area: 2.63 m Body Mass Index: 49.57 kg/m   Physical Exam General Mental Status -Alert, cooperative and good historian. General Appearance-pleasant, Not in acute distress. Orientation-Oriented X3. Build & Nutrition-Obese. Gait-Antalgic.  Head and Neck Head-normocephalic, atraumatic . Neck Global Assessment - supple, no bruit auscultated on the right, no bruit auscultated on the left.  Eye Pupil - Bilateral-Regular and Round. Motion - Bilateral-EOMI.  Chest and Lung Exam Auscultation Breath sounds - clear at anterior chest wall and clear at posterior chest wall. Adventitious sounds - No Adventitious sounds.  Cardiovascular Auscultation Rhythm - Regular rate and rhythm. Heart Sounds - S1 WNL and S2 WNL. Murmurs & Other Heart Sounds - Auscultation of the heart reveals - No Murmurs.  Abdomen Palpation/Percussion Tenderness - Abdomen is non-tender to palpation. Rigidity (guarding) - Abdomen is soft. Auscultation Auscultation of the abdomen reveals - Bowel sounds normal.  Male Genitourinary Not done, not pertinent to present illness  Musculoskeletal He is upright, moderate distress. Mood and affect is appropriate. Walks with forward flexed and antalgic gait.  Lumbar spine exam reveals no evidence of soft tissue swelling, deformity or skin ecchymosis. On palpation there is no tenderness of the lumbar spine. No flank pain with percussion. The abdomen is soft and nontender. Nontender over the trochanters. No cellulitis or lymphadenopathy.  Pain with flexion and extension of the lumbar  spine. His straight leg raise with buttock and thigh pain bilaterally. Motor is 5/5 including tibialis anterior, plantar flexion, quadriceps and hamstrings. EHL 4+/5 on the left compared to the right. Patient is normoreflexic. There is no Babinski or clonus. Sensory exam is intact to light touch. Patient has good distal pulses. No DVT. No pain and normal range of motion without instability of the hips and ankles.  Knee exam on inspection reveals no evidence of soft tissue swelling, ecchymosis, deformity or erythema. On palpation there is no tenderness in the lateral joint line. Left knee, he is tender in the medial joint line. Patellofemoral pain with compression. Nontender over the fibular head or the peroneal nerve. Nontender over the quadriceps insertion of the patellar ligament insertion. The range of motion was full. Provocative maneuvers revealed a negative Lachman, negative anterior and posterior drawer and a negative McMurray. No instability was noted with varus and valgus stressing at 0 or 30 degrees. On manual motor test the quadriceps and hamstrings were 5/5. Sensory exam was intact to light touch.  Imaging CT myelogram indicates at L4-5 a significant block in the upright extended position at 4-5. This is severe, compression of the L5 roots. He has 3-4 stenosis as well. The 3-4 area, however, would be considered moderately severe.  Assessment & Plan  Lumbar disc displacement (M51.26)  Pt is scheduled for microlumbar decompression L3-4, L4-5 by Dr. Shelle Iron on 02/02/17. We discussed the procedure itself as well as risks, complications and alteratives including but not limited to DVT, PE, failure of procedure, need for secondary procedure, anesthesia risk, even death. Discussed post-op protocols, hospital stay, PT, activity restrictions/modifications post-op. Discussed that this surgery is to address his spinal stenosis but will not address any DDD which can cause ongoing  back pain in the future.  All his questions were answered and he desires to proceed. Will plan 3g Kefzol plus 1500mg  Vanco pre-op for infx prevention. He was hypertensive at his appt today as well as yesterday at his pre-op appt, multiple checks were done each visit. He has previously been dx with HTN and was on Norvasc in the past. He called to let us know the name of the medication after his appt today. We have instructed him to restart his prior Norvasc as previously prescribed to get his HTN under better control for surgery. He can continue his natural Plavinol if desired as well however it does not seem to be effective as evidenced with his BP readings yesterday and today. His Creatinine was elevated on his pre-op labs at 1.57 and we discussed that this early decline in kidney function could very well be attributed to his longstanding untx HTN. I have called his NCM Sheryl Long today at [phone] and left her a voicemail regarding the situation. He would benefit getting into a PCP or urgent care ASAP for follow up on his HTN, pre-operatively would be preferred for assistance in preparing him for surgery. If his BP reading is as high when he presents for surgery, he will likely be cancelled and postponed until his pressure is better controlled. He was made aware of the significance of this today at his appt. He was instructed to restart his Norvasc today by Dr. Shelle IronBeane. If he has adverse effects from this it is even more important for him to establish a PCP or present to urgent care to have this managed. I have refilled his Percocet in the interim. He will follow up 2 weeks post-op approx 3/8 for staple removal and call with questions or concerns.  Plan microlumbar decompression L3-4, L4-5  Signed electronically by Dorothy SparkJaclyn M Bissell, PA-C for Dr. Shelle IronBeane

## 2017-02-02 ENCOUNTER — Ambulatory Visit (HOSPITAL_COMMUNITY): Admission: RE | Admit: 2017-02-02 | Payer: Worker's Compensation | Source: Ambulatory Visit | Admitting: Specialist

## 2017-02-02 ENCOUNTER — Encounter (HOSPITAL_COMMUNITY): Admission: RE | Payer: Self-pay | Source: Ambulatory Visit

## 2017-02-02 SURGERY — LUMBAR LAMINECTOMY/DECOMPRESSION MICRODISCECTOMY 2 LEVELS
Anesthesia: General

## 2017-03-06 NOTE — Progress Notes (Signed)
Please place orders in EPIC as patient is being scheduled for a Pre-operative appointment! Thank you! 

## 2017-03-07 ENCOUNTER — Ambulatory Visit: Payer: Self-pay | Admitting: Orthopedic Surgery

## 2017-03-20 ENCOUNTER — Ambulatory Visit: Payer: Self-pay | Admitting: Orthopedic Surgery

## 2017-03-20 NOTE — H&P (Signed)
Michael Holland is an 55 y.o. male.   Chief Complaint: back and leg pain HPI: The patient is a 55 year old male who presents today for follow up of their back. The patient is being followed for their low back symptoms. They are now 4 years, 9 months out from injury (DOI 05/15/12). Symptoms reported today include: pain. Current treatment includes: relative rest, activity modification and pain medications. The following medication has been used for pain control: Percocet. The patient indicates that they have questions or concerns today regarding surgery. Note for "Follow-up back": The patient is out of work. [name].  Michael Holland follows up. He continues to report right lower extremity radicular pain down into the great toe. He is still taking occasional Percocet. He remains out of work. He had got his operative clearance. His weight is 340. He continues to report his ambulatory range is diminished.  Past Medical History:  Diagnosis Date  . GERD (gastroesophageal reflux disease)    occ  . Gout   . H/O gastric bypass   . Hypertension   . MRSA (methicillin resistant Staphylococcus aureus) colonization    nasal swab-had skin leasion years ago in [place]  . Sleep apnea    hx pre-gastric bp-lost 100lb    Past Surgical History:  Procedure Laterality Date  . ACHILLES TENDON SURGERY  6/13   fx foot  . EXCISION HAGLUND'S DEFORMITY WITH ACHILLES TENDON REPAIR Right 08/22/2013   Procedure: RIGHT ACHILLES DEBRIDEMENT/GASTROC RECESSION/HAGLUND EXCISION RIGHT FOOT AND REMOVAL PAINFUL HARDWARE RIGHT FOOT ;  Surgeon: John Hewitt, MD;  Location: [facility];  Service: Orthopedics;  Laterality: Right;  . GASTRIC BYPASS OPEN  2010   [place]  . HARDWARE REMOVAL Left 08/22/2013   Procedure: LEFT KNEE PAINFUL HARDWARE REMOVAL;  Surgeon: John Hewitt, MD;  Location: [facility];  Service: Orthopedics;  Laterality: Left;  . HERNIA REPAIR  2008   umb hernia-DSC   . KNEE RECONSTRUCTION  6/13   post fx[place]    No family history on file. Social History:  reports that he has never smoked. He has never used smokeless tobacco. He reports that he drinks alcohol. He reports that he does not use drugs.  Allergies: No Known Allergies   (Not in a hospital admission)  No results found for this or any previous visit (from the past 48 hour(s)). No results found.  Review of Systems  Constitutional: Negative.   HENT: Negative.   Eyes: Negative.   Respiratory: Negative.   Cardiovascular: Negative.   Gastrointestinal: Negative.   Genitourinary: Negative.   Musculoskeletal: Positive for back pain and joint pain.  Skin: Negative.   Neurological: Positive for sensory change and focal weakness.  Psychiatric/Behavioral: Negative.     There were no vitals taken for this visit. Physical Exam  Constitutional: He is oriented to person, place, and time. He appears distressed.  Morbidly obese  HENT:  Head: Normocephalic.  Eyes: Pupils are equal, round, and reactive to light.  Neck: Normal range of motion.  Cardiovascular: Normal rate.   Respiratory: Effort normal.  GI: Soft.  Musculoskeletal:  He is in moderate distress. Mood and affect is appropriate. He is tender in the right proximal gluteus. Straight leg raise on the right, he has buttock, thigh, and calf pain. On the left, he has buttock pain. He has EHL weakness, 4+/5 on the right compared to the left. Pain with flexion and extension of the lumbar spine but limited.    Lumbar spine exam reveals no evidence of soft tissue swelling, deformity or skin ecchymosis. On palpation there is no tenderness of the lumbar spine. No flank pain with percussion. The abdomen is soft and nontender. Nontender over the trochanters. No cellulitis or lymphadenopathy.  Straight leg raise on the right, he has buttock, thigh, and calf pain. On the left, he has buttock pain. He has EHL weakness, 4+/5 on the right  compared to the left. Pain with flexion and extension of the lumbar spine but limited. Motor is 5/5 including EHL, tibialis anterior, plantar flexion, quadriceps and hamstrings. Patient is normoreflexic. There is no Babinski or clonus. Sensory exam is intact to light touch. Patient has good distal pulses. No DVT. No pain and normal range of motion without instability of the hips, knees and ankles.  Inspection of the cervical spine reveals a normal lordosis without evidence of paraspinous spasms or soft tissue swelling. Nontender to palpation. Full flexion, full extension, full left and right lateral rotation. Extension combined with lateral flexion does not reproduce pain. Negative impingement sign, negative secondary impingement sign of the shoulders. Negative Tinel's median and ulnar nerves at the elbow. Negative carpal compression test at the wrist. Motor of the upper extremities is 5/5 including biceps, triceps, brachioradialis, wrist flexion, wrist extension, finger flexion, finger extension. Reflexes are normoreflexic. Sensory exam is intact to light touch. There is no Hoffmann sign. Nontender over the thoracic spine.  Neurological: He is alert and oriented to person, place, and time.  Skin: Skin is warm and dry.    Radiographs of the lumbar spine today, AP and lateral demonstrates no further collapse.  Patient pain drawing today demonstrates a right buttock, posterior thigh, calf as well as the anterolateral aspect of the leg.  Again, his CT myelogram demonstrated, this was done back on 08/02/2016, severe stenosis, 4-5, congenitally short, also disk protrusion, also degenerative changes and disk protrusion at 3-4 with right L4 and L3 nerve root impingement, L2, possible nerve root impingement at 2-3. He has had no anterior groin pain.  He has reported some changes and he has some left-sided symptoms as well.  His last MRI was in 2016.  I have reviewed his MRI and his treatment to  date.  Updated MRI 03/2017: L4-5 central stenosis mod-severe with R lateral recess and foraminal stenosis. L3-4 Large L sided foraminal extrusion. L2-3 large R sided extraforaminal extrusion. L lateral recess stenosis and R Foraminal stenosis at L5-S1.  Assessment/Plan 1. Neurogenic claudication secondary to spinal stenosis, predominantly at the L4-L5. It extends up to the pedicles of 4, which is at the 3-4 space. He has some L4-L3 pain on the right, could be secondary to this stenosis at 3-4. 2. Elevated BMI. 3. Hypertension 4. Posttraumatic osteoarthrosis of the knee.  We had extensive discussion concerning current pathology, relevant anatomy, treatment options, and his options at this point in time is to write him a maximum medical improvement and sign an impairment rating and restrictions and pain management versus consideration of a decompression at 4-5 and 3-4 on the right. This most likely will be accomplished by a central decompression, removal of neural arch at 4 as his stenosis extends up to the top of the pedicle of 4. We discussed risks and benefits. I had an extensive discussion of the risks and benefits of the lumbar decompression with the patient including bleeding, infection, damage to neurovascular structures, epidural fibrosis, CSF leak requiring repair. We also discussed increase in pain, adjacent segment disease, recurrent disc herniation,   need for future surgery including repeat decompression and/or fusion. We also discussed risks of postoperative hematoma, paralysis, anesthetic complications including DVT, PE, death, cardiopulmonary dysfunction. In addition, the perioperative and postoperative courses were discussed in detail including the rehabilitative time and return to functional activity and work. I provided the patient with an illustrated handout and utilized the appropriate surgical models.  We also specifically stated that this would not address his back pain, although he may  get some relief of back pain following this decompression, although he does have mild spondylosis at multiple levels, moderate at 5-1. I do not feel a concomitant multilevel fusion is indicated. He is in agreement of that. We discussed overnight in the hospital, continue weight reduction, and postoperative course in detail three to four months postoperatively to maximum medical improvement.  We discussed this separately in front of him with his case manager, Tora Duck. We spent over 40 minutes discussing all these issues. We will have him scheduled for that procedure prior to that given the duration. His last MRI was in 2016. We will obtain a final MRI and determine whether there has been any interval change, further disk herniations, etc. I will give him a call with that result. Continue out-of-work prescription given. Continue p.r.n. Percocet. Continue with his weight reduction and activity modification. We did give a clearance.  Plan microlumbar decompression L3-4, L4-5  Diamante Truszkowski, Dayna Barker., PA-C for Dr. Shelle Iron 03/20/2017, 9:29 AM

## 2017-03-22 ENCOUNTER — Other Ambulatory Visit (HOSPITAL_COMMUNITY): Payer: Self-pay | Admitting: Emergency Medicine

## 2017-03-22 NOTE — Patient Instructions (Signed)
Michael Holland  03/22/2017   Your procedure is scheduled on: 03-29-17  Report to [facility] Main  Entrance Take [place]  elevators to 3rd floor to  Short Stay Center at 630AM.   Call this number if you have problems the morning of surgery [phone]    Remember: ONLY 1 PERSON MAY GO WITH YOU TO SHORT STAY TO GET  READY MORNING OF YOUR SURGERY.  Do not eat food or drink liquids :After Midnight.     Take these medicines the morning of surgery with A SIP OF WATER: amlodipine(Norvasc), metoprolol, percocet as needed                                You may not have any metal on your body including hair pins and              piercings  Do not wear jewelry, make-up, lotions, powders or perfumes, deodorant                         Men may shave face and neck.   Do not bring valuables to the hospital. [facility] [facility] IS NOT             RESPONSIBLE   FOR VALUABLES.  Contacts, dentures or bridgework may not be worn into surgery.  Leave suitcase in the car. After surgery it may be brought to your room.              Please read over the following fact sheets you were given: _____________________________________________________________________             [facility] - Preparing for Surgery Before surgery, you can play an important role.  Because skin is not sterile, your skin needs to be as free of germs as possible.  You can reduce the number of germs on your skin by washing with CHG (chlorahexidine gluconate) soap before surgery.  CHG is an antiseptic cleaner which kills germs and bonds with the skin to continue killing germs even after washing. Please DO NOT use if you have an allergy to CHG or antibacterial soaps.  If your skin becomes reddened/irritated stop using the CHG and inform your nurse when you arrive at Short Stay. Do not shave (including legs and underarms) for at least 48 hours prior to the first CHG shower.  You may shave your face/neck. Please follow these  instructions carefully:  1.  Shower with CHG Soap the night before surgery and the  morning of Surgery.  2.  If you choose to wash your hair, wash your hair first as usual with your  normal  shampoo.  3.  After you shampoo, rinse your hair and body thoroughly to remove the  shampoo.                           4.  Use CHG as you would any other liquid soap.  You can apply chg directly  to the skin and wash                       Gently with a scrungie or clean washcloth.  5.  Apply the CHG Soap to your body ONLY FROM THE NECK DOWN.   Do not use on face/  open                           Wound or open sores. Avoid contact with eyes, ears mouth and genitals (private parts).                       Wash face,  Genitals (private parts) with your normal soap.             6.  Wash thoroughly, paying special attention to the area where your surgery  will be performed.  7.  Thoroughly rinse your body with warm water from the neck down.  8.  DO NOT shower/wash with your normal soap after using and rinsing off  the CHG Soap.                9.  Pat yourself dry with a clean towel.            10.  Wear clean pajamas.            11.  Place clean sheets on your bed the night of your first shower and do not  sleep with pets. Day of Surgery : Do not apply any lotions/deodorants the morning of surgery.  Please wear clean clothes to the hospital/surgery center.  FAILURE TO FOLLOW THESE INSTRUCTIONS MAY RESULT IN THE CANCELLATION OF YOUR SURGERY PATIENT SIGNATURE_________________________________  NURSE SIGNATURE__________________________________  ________________________________________________________________________   Michael Holland  An incentive spirometer is a tool that can help keep your lungs clear and active. This tool measures how well you are filling your lungs with each breath. Taking long deep breaths may help reverse or decrease the chance of developing breathing (pulmonary) problems (especially  infection) following:  A long period of time when you are unable to move or be active. BEFORE THE PROCEDURE   If the spirometer includes an indicator to show your best effort, your nurse or respiratory therapist will set it to a desired goal.  If possible, sit up straight or lean slightly forward. Try not to slouch.  Hold the incentive spirometer in an upright position. INSTRUCTIONS FOR USE  1. Sit on the edge of your bed if possible, or sit up as far as you can in bed or on a chair. 2. Hold the incentive spirometer in an upright position. 3. Breathe out normally. 4. Place the mouthpiece in your mouth and seal your lips tightly around it. 5. Breathe in slowly and as deeply as possible, raising the piston or the ball toward the top of the column. 6. Hold your breath for 3-5 seconds or for as long as possible. Allow the piston or ball to fall to the bottom of the column. 7. Remove the mouthpiece from your mouth and breathe out normally. 8. Rest for a few seconds and repeat Steps 1 through 7 at least 10 times every 1-2 hours when you are awake. Take your time and take a few normal breaths between deep breaths. 9. The spirometer may include an indicator to show your best effort. Use the indicator as a goal to work toward during each repetition. 10. After each set of 10 deep breaths, practice coughing to be sure your lungs are clear. If you have an incision (the cut made at the time of surgery), support your incision when coughing by placing a pillow or rolled up towels firmly against it. Once you are able to get out of bed, walk around indoors and  cough well. You may stop using the incentive spirometer when instructed by your caregiver.  RISKS AND COMPLICATIONS  Take your time so you do not get dizzy or light-headed.  If you are in pain, you may need to take or ask for pain medication before doing incentive spirometry. It is harder to take a deep breath if you are having pain. AFTER  USE  Rest and breathe slowly and easily.  It can be helpful to keep track of a log of your progress. Your caregiver can provide you with a simple table to help with this. If you are using the spirometer at home, follow these instructions: [place] IF:   You are having difficultly using the spirometer.  You have trouble using the spirometer as often as instructed.  Your pain medication is not giving enough relief while using the spirometer.  You develop fever of 100.5 F (38.1 C) or higher. SEEK IMMEDIATE MEDICAL CARE IF:   You cough up bloody sputum that had not been present before.  You develop fever of 102 F (38.9 C) or greater.  You develop worsening pain at or near the incision site. MAKE SURE YOU:   Understand these instructions.  Will watch your condition.  Will get help right away if you are not doing well or get worse. Document Released: 04/10/2007 Document Revised: 02/20/2012 Document Reviewed: 06/11/2007 [facility] Patient Information 2014 [place], [place].   ________________________________________________________________________

## 2017-03-22 NOTE — Progress Notes (Signed)
EKG 01-23-17 epic

## 2017-03-24 ENCOUNTER — Encounter (INDEPENDENT_AMBULATORY_CARE_PROVIDER_SITE_OTHER): Payer: Self-pay

## 2017-03-24 ENCOUNTER — Encounter (HOSPITAL_COMMUNITY): Payer: Self-pay

## 2017-03-24 ENCOUNTER — Ambulatory Visit (HOSPITAL_COMMUNITY)
Admission: RE | Admit: 2017-03-24 | Discharge: 2017-03-24 | Disposition: A | Discharge status: Home / Self Care | Payer: Self-pay | Source: Ambulatory Visit | Attending: Orthopedic Surgery | Admitting: Orthopedic Surgery

## 2017-03-24 ENCOUNTER — Encounter (HOSPITAL_COMMUNITY)
Admission: RE | Admit: 2017-03-24 | Discharge: 2017-03-24 | Disposition: A | Discharge status: Home / Self Care | Payer: Worker's Compensation | Source: Ambulatory Visit | Attending: Specialist | Admitting: Specialist

## 2017-03-24 DIAGNOSIS — Z01812 Encounter for preprocedural laboratory examination: Secondary | ICD-10-CM | POA: Diagnosis present

## 2017-03-24 DIAGNOSIS — Z01810 Encounter for preprocedural cardiovascular examination: Secondary | ICD-10-CM | POA: Insufficient documentation

## 2017-03-24 DIAGNOSIS — M48061 Spinal stenosis, lumbar region without neurogenic claudication: Secondary | ICD-10-CM | POA: Insufficient documentation

## 2017-03-24 DIAGNOSIS — M5137 Other intervertebral disc degeneration, lumbosacral region: Secondary | ICD-10-CM | POA: Insufficient documentation

## 2017-03-24 DIAGNOSIS — M5126 Other intervertebral disc displacement, lumbar region: Secondary | ICD-10-CM

## 2017-03-24 HISTORY — DX: Low back pain: M54.5

## 2017-03-24 HISTORY — DX: Low back pain, unspecified: M54.50

## 2017-03-24 LAB — BASIC METABOLIC PANEL
ANION GAP: 7 (ref 5–15)
BUN: 21 mg/dL — ABNORMAL HIGH (ref 6–20)
CHLORIDE: 105 mmol/L (ref 101–111)
CO2: 27 mmol/L (ref 22–32)
CREATININE: 1.55 mg/dL — AB (ref 0.61–1.24)
Calcium: 9.2 mg/dL (ref 8.9–10.3)
GFR calc non Af Amer: 49 mL/min — ABNORMAL LOW (ref >60)
GFR, EST AFRICAN AMERICAN: 57 mL/min — AB (ref >60)
Glucose, Bld: 93 mg/dL (ref 65–99)
POTASSIUM: 3.9 mmol/L (ref 3.5–5.1)
SODIUM: 139 mmol/L (ref 135–145)

## 2017-03-24 LAB — CBC
HCT: 41.9 % (ref 39.0–52.0)
HEMOGLOBIN: 13.8 g/dL (ref 13.0–17.0)
MCH: 28.3 pg (ref 26.0–34.0)
MCHC: 32.9 g/dL (ref 30.0–36.0)
MCV: 86 fL (ref 78.0–100.0)
Platelets: 237 10*3/uL (ref 150–400)
RBC: 4.87 MIL/uL (ref 4.22–5.81)
RDW: 15.1 % (ref 11.5–15.5)
WBC: 8.6 10*3/uL (ref 4.0–10.5)

## 2017-03-24 LAB — SURGICAL PCR SCREEN
MRSA, PCR: NEGATIVE
Staphylococcus aureus: NEGATIVE

## 2017-03-24 NOTE — Progress Notes (Signed)
BMP results routed to Dr Shelle Iron via epic

## 2017-03-28 MED ORDER — DEXTROSE 5 % IV SOLN
3.0000 g | INTRAVENOUS | Status: AC
  Administered 2017-03-29: 3 g via INTRAVENOUS
  Filled 2017-03-28: qty 3000
  Filled 2017-03-28: qty 3

## 2017-03-28 MED ORDER — SODIUM CHLORIDE 0.9 % IV SOLN
1500.0000 mg | INTRAVENOUS | Status: DC
  Filled 2017-03-28: qty 1500

## 2017-03-29 ENCOUNTER — Inpatient Hospital Stay (HOSPITAL_COMMUNITY)
Admission: RE | Admit: 2017-03-29 | Discharge: 2017-03-31 | DRG: 519 | Disposition: A | Discharge status: Home / Self Care | Payer: Worker's Compensation | Source: Ambulatory Visit | Attending: Specialist | Admitting: Specialist

## 2017-03-29 ENCOUNTER — Ambulatory Visit (HOSPITAL_COMMUNITY): Payer: Worker's Compensation

## 2017-03-29 ENCOUNTER — Encounter (HOSPITAL_COMMUNITY): Payer: Self-pay

## 2017-03-29 ENCOUNTER — Encounter (HOSPITAL_COMMUNITY)
Admission: RE | Disposition: A | Discharge status: Home / Self Care | Payer: Self-pay | Source: Ambulatory Visit | Attending: Specialist

## 2017-03-29 ENCOUNTER — Ambulatory Visit (HOSPITAL_COMMUNITY): Payer: Worker's Compensation | Admitting: Anesthesiology

## 2017-03-29 DIAGNOSIS — R066 Hiccough: Secondary | ICD-10-CM | POA: Diagnosis present

## 2017-03-29 DIAGNOSIS — M48061 Spinal stenosis, lumbar region without neurogenic claudication: Principal | ICD-10-CM | POA: Diagnosis present

## 2017-03-29 DIAGNOSIS — Z9884 Bariatric surgery status: Secondary | ICD-10-CM

## 2017-03-29 DIAGNOSIS — G4730 Sleep apnea, unspecified: Secondary | ICD-10-CM | POA: Diagnosis present

## 2017-03-29 DIAGNOSIS — M1730 Unilateral post-traumatic osteoarthritis, unspecified knee: Secondary | ICD-10-CM | POA: Diagnosis present

## 2017-03-29 DIAGNOSIS — G9741 Accidental puncture or laceration of dura during a procedure: Secondary | ICD-10-CM | POA: Diagnosis not present

## 2017-03-29 DIAGNOSIS — Z79899 Other long term (current) drug therapy: Secondary | ICD-10-CM

## 2017-03-29 DIAGNOSIS — Z6841 Body Mass Index (BMI) 40.0 and over, adult: Secondary | ICD-10-CM

## 2017-03-29 DIAGNOSIS — Y838 Other surgical procedures as the cause of abnormal reaction of the patient, or of later complication, without mention of misadventure at the time of the procedure: Secondary | ICD-10-CM | POA: Diagnosis present

## 2017-03-29 DIAGNOSIS — K219 Gastro-esophageal reflux disease without esophagitis: Secondary | ICD-10-CM | POA: Diagnosis present

## 2017-03-29 DIAGNOSIS — I10 Essential (primary) hypertension: Secondary | ICD-10-CM | POA: Diagnosis present

## 2017-03-29 DIAGNOSIS — Z419 Encounter for procedure for purposes other than remedying health state, unspecified: Secondary | ICD-10-CM

## 2017-03-29 DIAGNOSIS — G9612 Meningeal adhesions (cerebral) (spinal): Secondary | ICD-10-CM | POA: Diagnosis present

## 2017-03-29 HISTORY — PX: LUMBAR LAMINECTOMY/DECOMPRESSION MICRODISCECTOMY: SHX5026

## 2017-03-29 SURGERY — LUMBAR LAMINECTOMY/DECOMPRESSION MICRODISCECTOMY 2 LEVELS
Anesthesia: General

## 2017-03-29 MED ORDER — OXYCODONE-ACETAMINOPHEN 10-325 MG PO TABS: 1.0000 | ORAL_TABLET | ORAL | 0 refills | Status: AC | PRN

## 2017-03-29 MED ORDER — ACETAMINOPHEN 10 MG/ML IV SOLN
1000.0000 mg | Freq: Four times a day (QID) | INTRAVENOUS | Status: DC
Start: 2017-03-29 — End: 2017-03-29
  Administered 2017-03-29: 1000 mg via INTRAVENOUS

## 2017-03-29 MED ORDER — SUCCINYLCHOLINE CHLORIDE 200 MG/10ML IV SOSY
PREFILLED_SYRINGE | INTRAVENOUS | Status: DC | PRN
  Administered 2017-03-29: 160 mg via INTRAVENOUS

## 2017-03-29 MED ORDER — OXYCODONE HCL 5 MG PO TABS
5.0000 mg | ORAL_TABLET | ORAL | Status: DC | PRN
  Administered 2017-03-29 – 2017-03-31 (×11): 10 mg via ORAL
  Filled 2017-03-29 (×11): qty 2

## 2017-03-29 MED ORDER — VANCOMYCIN HCL 500 MG IV SOLR
500.0000 mg | Freq: Once | INTRAVENOUS | Status: AC
  Administered 2017-03-29: 500 mg via INTRAVENOUS
  Administered 2017-03-29: 09:00:00 via INTRAVENOUS
  Filled 2017-03-29: qty 500

## 2017-03-29 MED ORDER — BISACODYL 5 MG PO TBEC: 5.0000 mg | DELAYED_RELEASE_TABLET | Freq: Every day | ORAL | Status: DC | PRN

## 2017-03-29 MED ORDER — PHENYLEPHRINE 40 MCG/ML (10ML) SYRINGE FOR IV PUSH (FOR BLOOD PRESSURE SUPPORT)
PREFILLED_SYRINGE | INTRAVENOUS | Status: AC
  Filled 2017-03-29: qty 10

## 2017-03-29 MED ORDER — METHOCARBAMOL 1000 MG/10ML IJ SOLN
500.0000 mg | Freq: Four times a day (QID) | INTRAVENOUS | Status: DC | PRN
  Administered 2017-03-29 – 2017-03-30 (×2): 500 mg via INTRAVENOUS
  Filled 2017-03-29 (×2): qty 550

## 2017-03-29 MED ORDER — METHOCARBAMOL 500 MG PO TABS: 500.0000 mg | ORAL_TABLET | Freq: Four times a day (QID) | ORAL | 1 refills | Status: DC | PRN

## 2017-03-29 MED ORDER — SODIUM CHLORIDE 0.9 % IR SOLN
Status: AC
  Filled 2017-03-29: qty 500000

## 2017-03-29 MED ORDER — SODIUM CHLORIDE 0.9 % IV SOLN
INTRAVENOUS | Status: DC | PRN
  Administered 2017-03-29: 25 ug/min via INTRAVENOUS

## 2017-03-29 MED ORDER — MIDAZOLAM HCL 5 MG/5ML IJ SOLN
INTRAMUSCULAR | Status: DC | PRN
  Administered 2017-03-29: 2 mg via INTRAVENOUS

## 2017-03-29 MED ORDER — HYDROCHLOROTHIAZIDE 25 MG PO TABS
25.0000 mg | ORAL_TABLET | Freq: Every day | ORAL | Status: DC
  Administered 2017-03-29 – 2017-03-31 (×3): 25 mg via ORAL
  Filled 2017-03-29 (×3): qty 1

## 2017-03-29 MED ORDER — PROPOFOL 10 MG/ML IV BOLUS
INTRAVENOUS | Status: DC | PRN
  Administered 2017-03-29: 250 mg via INTRAVENOUS

## 2017-03-29 MED ORDER — MIDAZOLAM HCL 2 MG/2ML IJ SOLN
INTRAMUSCULAR | Status: AC
  Filled 2017-03-29: qty 2

## 2017-03-29 MED ORDER — LIDOCAINE-EPINEPHRINE 1 %-1:100000 IJ SOLN
INTRAMUSCULAR | Status: DC | PRN
  Administered 2017-03-29: 20 mL

## 2017-03-29 MED ORDER — BUPIVACAINE HCL (PF) 0.5 % IJ SOLN
INTRAMUSCULAR | Status: AC
  Filled 2017-03-29: qty 30

## 2017-03-29 MED ORDER — OXYCODONE HCL 5 MG PO TABS: 5.0000 mg | ORAL_TABLET | Freq: Once | ORAL | Status: DC | PRN

## 2017-03-29 MED ORDER — VANCOMYCIN HCL IN DEXTROSE 1-5 GM/200ML-% IV SOLN
1000.0000 mg | Freq: Once | INTRAVENOUS | Status: AC
  Administered 2017-03-30: 1000 mg via INTRAVENOUS
  Filled 2017-03-29: qty 200

## 2017-03-29 MED ORDER — OXYCODONE HCL 5 MG/5ML PO SOLN: 5.0000 mg | Freq: Once | ORAL | Status: DC | PRN

## 2017-03-29 MED ORDER — IRBESARTAN 150 MG PO TABS
300.0000 mg | ORAL_TABLET | Freq: Every day | ORAL | Status: DC
  Administered 2017-03-29 – 2017-03-31 (×3): 300 mg via ORAL
  Filled 2017-03-29 (×3): qty 2

## 2017-03-29 MED ORDER — ALUM & MAG HYDROXIDE-SIMETH 200-200-20 MG/5ML PO SUSP: 30.0000 mL | Freq: Four times a day (QID) | ORAL | Status: DC | PRN

## 2017-03-29 MED ORDER — SODIUM CHLORIDE 0.9 % IR SOLN
Status: DC | PRN
  Administered 2017-03-29: 1000 mL

## 2017-03-29 MED ORDER — ONDANSETRON HCL 4 MG/2ML IJ SOLN
INTRAMUSCULAR | Status: AC
  Filled 2017-03-29: qty 2

## 2017-03-29 MED ORDER — ACETAMINOPHEN 10 MG/ML IV SOLN
INTRAVENOUS | Status: AC
  Filled 2017-03-29: qty 100

## 2017-03-29 MED ORDER — LIDOCAINE 2% (20 MG/ML) 5 ML SYRINGE
INTRAMUSCULAR | Status: DC | PRN
  Administered 2017-03-29 (×2): 100 mg via INTRAVENOUS

## 2017-03-29 MED ORDER — SODIUM CHLORIDE 0.9 % IV SOLN
INTRAVENOUS | Status: DC | PRN
  Administered 2017-03-29: 1000 mg via INTRAVENOUS

## 2017-03-29 MED ORDER — ONDANSETRON HCL 4 MG/2ML IJ SOLN
INTRAMUSCULAR | Status: DC | PRN
  Administered 2017-03-29: 4 mg via INTRAVENOUS

## 2017-03-29 MED ORDER — PHENOL 1.4 % MT LIQD
1.0000 | OROMUCOSAL | Status: DC | PRN
Start: 2017-03-29 — End: 2017-03-31

## 2017-03-29 MED ORDER — LIDOCAINE 2% (20 MG/ML) 5 ML SYRINGE
INTRAMUSCULAR | Status: AC
  Filled 2017-03-29: qty 5

## 2017-03-29 MED ORDER — DEXAMETHASONE SODIUM PHOSPHATE 10 MG/ML IJ SOLN
INTRAMUSCULAR | Status: AC
  Filled 2017-03-29: qty 1

## 2017-03-29 MED ORDER — ACETAMINOPHEN 325 MG PO TABS: 650.0000 mg | ORAL_TABLET | ORAL | Status: DC | PRN

## 2017-03-29 MED ORDER — FENTANYL CITRATE (PF) 100 MCG/2ML IJ SOLN
INTRAMUSCULAR | Status: DC | PRN
  Administered 2017-03-29 (×5): 50 ug via INTRAVENOUS

## 2017-03-29 MED ORDER — HYDROMORPHONE HCL 2 MG/ML IJ SOLN
0.2500 mg | INTRAMUSCULAR | Status: DC | PRN
  Administered 2017-03-29: 0.5 mg via INTRAVENOUS
  Administered 2017-03-29 (×2): 0.3 mg via INTRAVENOUS

## 2017-03-29 MED ORDER — FENTANYL CITRATE (PF) 250 MCG/5ML IJ SOLN
INTRAMUSCULAR | Status: AC
  Filled 2017-03-29: qty 5

## 2017-03-29 MED ORDER — ACETAMINOPHEN 650 MG RE SUPP: 650.0000 mg | RECTAL | Status: DC | PRN

## 2017-03-29 MED ORDER — THROMBIN 5000 UNITS EX SOLR
CUTANEOUS | Status: AC
  Filled 2017-03-29: qty 10000

## 2017-03-29 MED ORDER — HYDROMORPHONE HCL 2 MG/ML IJ SOLN
1.0000 mg | INTRAMUSCULAR | Status: DC | PRN
  Administered 2017-03-29 – 2017-03-30 (×3): 1 mg via INTRAVENOUS
  Filled 2017-03-29 (×4): qty 1

## 2017-03-29 MED ORDER — EPHEDRINE SULFATE 50 MG/ML IJ SOLN
INTRAMUSCULAR | Status: DC | PRN
  Administered 2017-03-29 (×2): 10 mg via INTRAVENOUS
  Administered 2017-03-29: 5 mg via INTRAVENOUS
  Administered 2017-03-29: 10 mg via INTRAVENOUS

## 2017-03-29 MED ORDER — SUGAMMADEX SODIUM 500 MG/5ML IV SOLN
INTRAVENOUS | Status: AC
  Filled 2017-03-29: qty 5

## 2017-03-29 MED ORDER — HYDROMORPHONE HCL 2 MG/ML IJ SOLN
INTRAMUSCULAR | Status: AC
  Administered 2017-03-29: 0.5 mg via INTRAVENOUS
  Filled 2017-03-29: qty 1

## 2017-03-29 MED ORDER — SODIUM CHLORIDE 0.9 % IR SOLN
Status: DC | PRN
  Administered 2017-03-29: 500 mL

## 2017-03-29 MED ORDER — ROCURONIUM BROMIDE 50 MG/5ML IV SOSY
PREFILLED_SYRINGE | INTRAVENOUS | Status: AC
  Filled 2017-03-29: qty 5

## 2017-03-29 MED ORDER — KCL IN DEXTROSE-NACL 20-5-0.45 MEQ/L-%-% IV SOLN
INTRAVENOUS | Status: AC
  Administered 2017-03-29: 22:00:00 via INTRAVENOUS
  Administered 2017-03-29: 16:00:00 125 mL/h via INTRAVENOUS
  Administered 2017-03-30: 08:00:00 via INTRAVENOUS
  Filled 2017-03-29 (×4): qty 1000

## 2017-03-29 MED ORDER — ESMOLOL HCL 100 MG/10ML IV SOLN
INTRAVENOUS | Status: DC | PRN
  Administered 2017-03-29: 20 mg via INTRAVENOUS

## 2017-03-29 MED ORDER — ONDANSETRON HCL 4 MG/2ML IJ SOLN: 4.0000 mg | Freq: Once | INTRAMUSCULAR | Status: DC | PRN

## 2017-03-29 MED ORDER — METOPROLOL SUCCINATE ER 50 MG PO TB24
200.0000 mg | ORAL_TABLET | Freq: Every day | ORAL | Status: DC
  Administered 2017-03-30 – 2017-03-31 (×2): 200 mg via ORAL
  Filled 2017-03-29 (×3): qty 4

## 2017-03-29 MED ORDER — PHENYLEPHRINE HCL 10 MG/ML IJ SOLN
INTRAMUSCULAR | Status: AC
  Filled 2017-03-29: qty 1

## 2017-03-29 MED ORDER — POLYETHYLENE GLYCOL 3350 17 G PO PACK: 17.0000 g | PACK | Freq: Every day | ORAL | Status: DC | PRN

## 2017-03-29 MED ORDER — MENTHOL 3 MG MT LOZG: 1.0000 | LOZENGE | OROMUCOSAL | Status: DC | PRN

## 2017-03-29 MED ORDER — RISAQUAD PO CAPS
1.0000 | ORAL_CAPSULE | Freq: Every day | ORAL | Status: DC
  Administered 2017-03-29 – 2017-03-31 (×3): 1 via ORAL
  Filled 2017-03-29 (×3): qty 1

## 2017-03-29 MED ORDER — EPHEDRINE 5 MG/ML INJ
INTRAVENOUS | Status: AC
  Filled 2017-03-29: qty 10

## 2017-03-29 MED ORDER — ONDANSETRON HCL 4 MG PO TABS: 4.0000 mg | ORAL_TABLET | Freq: Four times a day (QID) | ORAL | Status: DC | PRN

## 2017-03-29 MED ORDER — PHENYLEPHRINE HCL 10 MG/ML IJ SOLN
INTRAMUSCULAR | Status: DC | PRN
  Administered 2017-03-29 (×2): 80 ug via INTRAVENOUS

## 2017-03-29 MED ORDER — MAGNESIUM CITRATE PO SOLN: 1.0000 | Freq: Once | ORAL | Status: DC | PRN

## 2017-03-29 MED ORDER — VANCOMYCIN HCL IN DEXTROSE 1-5 GM/200ML-% IV SOLN
INTRAVENOUS | Status: AC
  Filled 2017-03-29: qty 200

## 2017-03-29 MED ORDER — ROCURONIUM BROMIDE 50 MG/5ML IV SOSY
PREFILLED_SYRINGE | INTRAVENOUS | Status: DC | PRN
  Administered 2017-03-29 (×2): 10 mg via INTRAVENOUS
  Administered 2017-03-29: 40 mg via INTRAVENOUS
  Administered 2017-03-29 (×2): 10 mg via INTRAVENOUS

## 2017-03-29 MED ORDER — THROMBIN 5000 UNITS EX SOLR
CUTANEOUS | Status: DC | PRN
  Administered 2017-03-29: 5000 [IU] via TOPICAL

## 2017-03-29 MED ORDER — AMLODIPINE BESYLATE 10 MG PO TABS
10.0000 mg | ORAL_TABLET | Freq: Every day | ORAL | Status: DC
  Administered 2017-03-29 – 2017-03-31 (×3): 10 mg via ORAL
  Filled 2017-03-29 (×3): qty 1

## 2017-03-29 MED ORDER — ONDANSETRON HCL 4 MG/2ML IJ SOLN: 4.0000 mg | Freq: Four times a day (QID) | INTRAMUSCULAR | Status: DC | PRN

## 2017-03-29 MED ORDER — POLYETHYLENE GLYCOL 3350 17 G PO PACK: 17.0000 g | PACK | Freq: Every day | ORAL | 0 refills | Status: AC

## 2017-03-29 MED ORDER — DOCUSATE SODIUM 100 MG PO CAPS: 100.0000 mg | ORAL_CAPSULE | Freq: Two times a day (BID) | ORAL | 1 refills | Status: DC | PRN

## 2017-03-29 MED ORDER — METOPROLOL SUCCINATE ER 100 MG PO TB24
200.0000 mg | ORAL_TABLET | Freq: Once | ORAL | Status: AC
  Administered 2017-03-29: 200 mg via ORAL
  Filled 2017-03-29: qty 2

## 2017-03-29 MED ORDER — METHOCARBAMOL 500 MG PO TABS
500.0000 mg | ORAL_TABLET | Freq: Four times a day (QID) | ORAL | Status: DC | PRN
  Administered 2017-03-29 – 2017-03-30 (×2): 500 mg via ORAL
  Filled 2017-03-29 (×2): qty 1

## 2017-03-29 MED ORDER — SUGAMMADEX SODIUM 200 MG/2ML IV SOLN
INTRAVENOUS | Status: DC | PRN
  Administered 2017-03-29: 400 mg via INTRAVENOUS

## 2017-03-29 MED ORDER — DEXAMETHASONE SODIUM PHOSPHATE 10 MG/ML IJ SOLN
INTRAMUSCULAR | Status: DC | PRN
  Administered 2017-03-29: 10 mg via INTRAVENOUS

## 2017-03-29 MED ORDER — VALSARTAN-HYDROCHLOROTHIAZIDE 320-25 MG PO TABS: 1.0000 | ORAL_TABLET | Freq: Every day | ORAL | Status: DC

## 2017-03-29 MED ORDER — PROPOFOL 10 MG/ML IV BOLUS
INTRAVENOUS | Status: AC
  Filled 2017-03-29: qty 20

## 2017-03-29 MED ORDER — SUCCINYLCHOLINE CHLORIDE 200 MG/10ML IV SOSY
PREFILLED_SYRINGE | INTRAVENOUS | Status: AC
  Filled 2017-03-29: qty 10

## 2017-03-29 MED ORDER — LACTATED RINGERS IV SOLN
INTRAVENOUS | Status: DC
  Administered 2017-03-29 (×2): via INTRAVENOUS

## 2017-03-29 MED ORDER — ESMOLOL HCL 100 MG/10ML IV SOLN
INTRAVENOUS | Status: AC
  Filled 2017-03-29: qty 10

## 2017-03-29 MED ORDER — DOCUSATE SODIUM 100 MG PO CAPS
100.0000 mg | ORAL_CAPSULE | Freq: Two times a day (BID) | ORAL | Status: DC
  Administered 2017-03-29 – 2017-03-31 (×4): 100 mg via ORAL
  Filled 2017-03-29 (×4): qty 1

## 2017-03-29 MED ORDER — LIDOCAINE-EPINEPHRINE 1 %-1:100000 IJ SOLN
INTRAMUSCULAR | Status: AC
  Filled 2017-03-29: qty 1

## 2017-03-29 SURGICAL SUPPLY — 51 items
BAG ZIPLOCK 12X15 (MISCELLANEOUS) IMPLANT
CLEANER TIP ELECTROSURG 2X2 (MISCELLANEOUS) ×3 IMPLANT
CLOSURE WOUND 1/2 X4 (GAUZE/BANDAGES/DRESSINGS)
CLOTH 2% CHLOROHEXIDINE 3PK (PERSONAL CARE ITEMS) ×3 IMPLANT
COVER SURGICAL LIGHT HANDLE (MISCELLANEOUS) ×3 IMPLANT
DRAPE MICROSCOPE LEICA (MISCELLANEOUS) ×3 IMPLANT
DRAPE SHEET LG 3/4 BI-LAMINATE (DRAPES) IMPLANT
DRAPE SURG 17X11 SM STRL (DRAPES) ×3 IMPLANT
DRAPE UTILITY XL STRL (DRAPES) ×3 IMPLANT
DRSG AQUACEL AG ADV 3.5X 4 (GAUZE/BANDAGES/DRESSINGS) IMPLANT
DRSG AQUACEL AG ADV 3.5X 6 (GAUZE/BANDAGES/DRESSINGS) ×3 IMPLANT
DURAFORM SPONGE 2X2 SINGLE (Neuro Prosthesis/Implant) ×6 IMPLANT
DURAPREP 26ML APPLICATOR (WOUND CARE) ×3 IMPLANT
DURASEAL SPINE SEALANT 3ML (MISCELLANEOUS) IMPLANT
ELECT BLADE TIP CTD 4 INCH (ELECTRODE) ×3 IMPLANT
ELECT REM PT RETURN 15FT ADLT (MISCELLANEOUS) ×3 IMPLANT
GLOVE BIOGEL PI IND STRL 7.0 (GLOVE) ×1 IMPLANT
GLOVE BIOGEL PI INDICATOR 7.0 (GLOVE) ×2
GLOVE SURG SS PI 7.0 STRL IVOR (GLOVE) ×3 IMPLANT
GLOVE SURG SS PI 8.0 STRL IVOR (GLOVE) ×6 IMPLANT
GOWN STRL REUS W/TWL XL LVL3 (GOWN DISPOSABLE) ×6 IMPLANT
HEMOSTAT SPONGE AVITENE ULTRA (HEMOSTASIS) IMPLANT
HEMOSTAT SURGICEL 2X3 (HEMOSTASIS) ×3 IMPLANT
IV CATH 14GX2 1/4 (CATHETERS) IMPLANT
KIT BASIN OR (CUSTOM PROCEDURE TRAY) ×3 IMPLANT
KIT POSITIONING SURG ANDREWS (MISCELLANEOUS) ×3 IMPLANT
MANIFOLD NEPTUNE II (INSTRUMENTS) ×3 IMPLANT
MARKER SKIN DUAL TIP RULER LAB (MISCELLANEOUS) ×3 IMPLANT
NEEDLE SPNL 18GX3.5 QUINCKE PK (NEEDLE) ×6 IMPLANT
PACK LAMINECTOMY ORTHO (CUSTOM PROCEDURE TRAY) ×3 IMPLANT
PATTIES SURGICAL .5 X.5 (GAUZE/BANDAGES/DRESSINGS) ×21 IMPLANT
PATTIES SURGICAL .75X.75 (GAUZE/BANDAGES/DRESSINGS) ×12 IMPLANT
PATTIES SURGICAL 1X1 (DISPOSABLE) IMPLANT
RUBBERBAND STERILE (MISCELLANEOUS) ×3 IMPLANT
SPONGE LAP 4X18 X RAY DECT (DISPOSABLE) ×3 IMPLANT
SPONGE SURGIFOAM ABS GEL 100 (HEMOSTASIS) ×3 IMPLANT
STAPLER VISISTAT (STAPLE) IMPLANT
STRIP CLOSURE SKIN 1/2X4 (GAUZE/BANDAGES/DRESSINGS) IMPLANT
SUT NURALON 4 0 TR CR/8 (SUTURE) ×3 IMPLANT
SUT PROLENE 3 0 PS 2 (SUTURE) IMPLANT
SUT VIC AB 1 CT1 27 (SUTURE) ×4
SUT VIC AB 1 CT1 27XBRD ANTBC (SUTURE) ×2 IMPLANT
SUT VIC AB 1-0 CT2 27 (SUTURE) ×9 IMPLANT
SUT VIC AB 2-0 CT1 27 (SUTURE) ×2
SUT VIC AB 2-0 CT1 TAPERPNT 27 (SUTURE) ×1 IMPLANT
SUT VIC AB 2-0 CT2 27 (SUTURE) IMPLANT
SUT VLOC 180 0 24IN GS25 (SUTURE) ×3 IMPLANT
SYR 3ML LL SCALE MARK (SYRINGE) IMPLANT
TOWEL OR 17X26 10 PK STRL BLUE (TOWEL DISPOSABLE) ×3 IMPLANT
TOWEL OR NON WOVEN STRL DISP B (DISPOSABLE) ×3 IMPLANT
YANKAUER SUCT BULB TIP NO VENT (SUCTIONS) ×3 IMPLANT

## 2017-03-29 NOTE — Progress Notes (Signed)
Do not log roll pt to assess dsg - per Dr Shelle Iron.

## 2017-03-29 NOTE — Progress Notes (Signed)
PHARMACY NOTE -  Vancomycin   Request received for Pharmacy to assist with postop vancomycin dose for surgical prophylaxis. Patient received Vancomycin 1.5g pre-op. S/p microlumbar decompression surgery. Weight 155 kg, SCr 1.55.  PLAN: Vancomycin 1g IV x 1 tonight. Will sign off at this time.  Please reconsult if needed.  Clance Boll, PharmD, BCPS Pager: [phone] 03/29/2017 3:31 PM

## 2017-03-29 NOTE — Brief Op Note (Signed)
03/29/2017  12:21 PM  PATIENT:  Michael Holland  56 y.o. male  PRE-OPERATIVE DIAGNOSIS:  Stenosis, L3-5  POST-OPERATIVE DIAGNOSIS:  Stenosis, L3-5  PROCEDURE:  Procedure(s): Microlumbar decompression L3-L4, L4-L5 (N/A)  SURGEON:  Surgeon(s) and Role:    * Jene Every, MD - Primary    * Ranee Gosselin, MD - Assisting  PHYSICIAN ASSISTANT:   ASSISTANTS: [place]   ANESTHESIA:   general  EBL:  Total I/O In: 1000 [I.V.:1000] Out: 450 [Urine:150; Blood:300]  BLOOD ADMINISTERED:none  DRAINS: none   LOCAL MEDICATIONS USED:  MARCAINE     SPECIMEN:  No Specimen  DISPOSITION OF SPECIMEN:  N/A  COUNTS:  YES  TOURNIQUET:  * No tourniquets in log *  DICTATION: .Other Dictation: Dictation Number [id number]  PLAN OF CARE: Admit for overnight observation  PATIENT DISPOSITION:  PACU - hemodynamically stable.   Delay start of Pharmacological VTE agent (>24hrs) due to surgical blood loss or risk of bleeding: yes

## 2017-03-29 NOTE — H&P (View-Only) (Signed)
Michael Holland is an 56 y.o. male.   Chief Complaint: back and leg pain HPI: The patient is a 56 year old male who presents today for follow up of their back. The patient is being followed for their low back symptoms. They are now 4 years, 9 months out from injury (DOI 05/15/12). Symptoms reported today include: pain. Current treatment includes: relative rest, activity modification and pain medications. The following medication has been used for pain control: Percocet. The patient indicates that they have questions or concerns today regarding surgery. Note for "Follow-up back": The patient is out of work. [name].  [name] follows up. He continues to report right lower extremity radicular pain down into the great toe. He is still taking occasional Percocet. He remains out of work. He had got his operative clearance. His weight is 340. He continues to report his ambulatory range is diminished.  Past Medical History:  Diagnosis Date  . GERD (gastroesophageal reflux disease)    occ  . Gout   . H/O gastric bypass   . Hypertension   . MRSA (methicillin resistant Staphylococcus aureus) colonization    nasal swab-had skin leasion years ago in [place]  . Sleep apnea    hx pre-gastric bp-lost 100lb    Past Surgical History:  Procedure Laterality Date  . ACHILLES TENDON SURGERY  6/13   fx foot  . EXCISION HAGLUND'S DEFORMITY WITH ACHILLES TENDON REPAIR Right 08/22/2013   Procedure: RIGHT ACHILLES DEBRIDEMENT/GASTROC RECESSION/HAGLUND EXCISION RIGHT FOOT AND REMOVAL PAINFUL HARDWARE RIGHT FOOT ;  Surgeon: Toni Arthurs, MD;  Location: [facility];  Service: Orthopedics;  Laterality: Right;  . GASTRIC BYPASS OPEN  2010   [place]  . HARDWARE REMOVAL Left 08/22/2013   Procedure: LEFT KNEE PAINFUL HARDWARE REMOVAL;  Surgeon: Toni Arthurs, MD;  Location: [facility];  Service: Orthopedics;  Laterality: Left;  . HERNIA REPAIR  2008   umb hernia-DSC   . KNEE RECONSTRUCTION  6/13   post fx-left=[place]    No family history on file. Social History:  reports that he has never smoked. He has never used smokeless tobacco. He reports that he drinks alcohol. He reports that he does not use drugs.  Allergies: No Known Allergies   (Not in a hospital admission)  No results found for this or any previous visit (from the past 48 hour(s)). No results found.  Review of Systems  Constitutional: Negative.   HENT: Negative.   Eyes: Negative.   Respiratory: Negative.   Cardiovascular: Negative.   Gastrointestinal: Negative.   Genitourinary: Negative.   Musculoskeletal: Positive for back pain and joint pain.  Skin: Negative.   Neurological: Positive for sensory change and focal weakness.  Psychiatric/Behavioral: Negative.     There were no vitals taken for this visit. Physical Exam  Constitutional: He is oriented to person, place, and time. He appears distressed.  Morbidly obese  HENT:  Head: Normocephalic.  Eyes: Pupils are equal, round, and reactive to light.  Neck: Normal range of motion.  Cardiovascular: Normal rate.   Respiratory: Effort normal.  GI: Soft.  Musculoskeletal:  He is in moderate distress. Mood and affect is appropriate. He is tender in the right proximal gluteus. Straight leg raise on the right, he has buttock, thigh, and calf pain. On the left, he has buttock pain. He has EHL weakness, 4+/5 on the right compared to the left. Pain with flexion and extension of the lumbar spine but limited.  Lumbar spine exam reveals no evidence of soft tissue swelling, deformity or skin ecchymosis. On palpation there is no tenderness of the lumbar spine. No flank pain with percussion. The abdomen is soft and nontender. Nontender over the trochanters. No cellulitis or lymphadenopathy.  Straight leg raise on the right, he has buttock, thigh, and calf pain. On the left, he has buttock pain. He has EHL weakness, 4+/5 on the right  compared to the left. Pain with flexion and extension of the lumbar spine but limited. Motor is 5/5 including EHL, tibialis anterior, plantar flexion, quadriceps and hamstrings. Patient is normoreflexic. There is no Babinski or clonus. Sensory exam is intact to light touch. Patient has good distal pulses. No DVT. No pain and normal range of motion without instability of the hips, knees and ankles.  Inspection of the cervical spine reveals a normal lordosis without evidence of paraspinous spasms or soft tissue swelling. Nontender to palpation. Full flexion, full extension, full left and right lateral rotation. Extension combined with lateral flexion does not reproduce pain. Negative impingement sign, negative secondary impingement sign of the shoulders. Negative Tinel's median and ulnar nerves at the elbow. Negative carpal compression test at the wrist. Motor of the upper extremities is 5/5 including biceps, triceps, brachioradialis, wrist flexion, wrist extension, finger flexion, finger extension. Reflexes are normoreflexic. Sensory exam is intact to light touch. There is no Hoffmann sign. Nontender over the thoracic spine.  Neurological: He is alert and oriented to person, place, and time.  Skin: Skin is warm and dry.    Radiographs of the lumbar spine today, AP and lateral demonstrates no further collapse.  Patient pain drawing today demonstrates a right buttock, posterior thigh, calf as well as the anterolateral aspect of the leg.  Again, his CT myelogram demonstrated, this was done back on 08/02/2016, severe stenosis, 4-5, congenitally short, also disk protrusion, also degenerative changes and disk protrusion at 3-4 with right L4 and L3 nerve root impingement, L2, possible nerve root impingement at 2-3. He has had no anterior groin pain.  He has reported some changes and he has some left-sided symptoms as well.  His last MRI was in 2016.  I have reviewed his MRI and his treatment to  date.  Updated MRI 03/2017: L4-5 central stenosis mod-severe with R lateral recess and foraminal stenosis. L3-4 Large L sided foraminal extrusion. L2-3 large R sided extraforaminal extrusion. L lateral recess stenosis and R Foraminal stenosis at L5-S1.  Assessment/Plan 1. Neurogenic claudication secondary to spinal stenosis, predominantly at the L4-L5. It extends up to the pedicles of 4, which is at the 3-4 space. He has some L4-L3 pain on the right, could be secondary to this stenosis at 3-4. 2. Elevated BMI. 3. Hypertension 4. Posttraumatic osteoarthrosis of the knee.  We had extensive discussion concerning current pathology, relevant anatomy, treatment options, and his options at this point in time is to write him a maximum medical improvement and sign an impairment rating and restrictions and pain management versus consideration of a decompression at 4-5 and 3-4 on the right. This most likely will be accomplished by a central decompression, removal of neural arch at 4 as his stenosis extends up to the top of the pedicle of 4. We discussed risks and benefits. I had an extensive discussion of the risks and benefits of the lumbar decompression with the patient including bleeding, infection, damage to neurovascular structures, epidural fibrosis, CSF leak requiring repair. We also discussed increase in pain, adjacent segment disease, recurrent disc herniation,  need for future surgery including repeat decompression and/or fusion. We also discussed risks of postoperative hematoma, paralysis, anesthetic complications including DVT, PE, death, cardiopulmonary dysfunction. In addition, the perioperative and postoperative courses were discussed in detail including the rehabilitative time and return to functional activity and work. I provided the patient with an illustrated handout and utilized the appropriate surgical models.  We also specifically stated that this would not address his back pain, although he may  get some relief of back pain following this decompression, although he does have mild spondylosis at multiple levels, moderate at 5-1. I do not feel a concomitant multilevel fusion is indicated. He is in agreement of that. We discussed overnight in the hospital, continue weight reduction, and postoperative course in detail three to four months postoperatively to maximum medical improvement.  We discussed this separately in front of him with his case manager, Tora Duck. We spent over 40 minutes discussing all these issues. We will have him scheduled for that procedure prior to that given the duration. His last MRI was in 2016. We will obtain a final MRI and determine whether there has been any interval change, further disk herniations, etc. I will give him a call with that result. Continue out-of-work prescription given. Continue p.r.n. Percocet. Continue with his weight reduction and activity modification. We did give a clearance.  Plan microlumbar decompression L3-4, L4-5  BISSELL, Dayna Barker., PA-C for Dr. Shelle Iron 03/20/2017, 9:29 AM

## 2017-03-29 NOTE — Anesthesia Preprocedure Evaluation (Signed)
Anesthesia Evaluation  Patient identified by MRN, date of birth, ID band Patient awake    Reviewed: Allergy & Precautions, NPO status , Patient's Chart, lab work & pertinent test results  Airway Mallampati: III  TM Distance: >3 FB Neck ROM: Full    Dental  (+) Teeth Intact, Dental Advisory Given   Pulmonary  breath sounds clear to auscultation        Cardiovascular hypertension, Rhythm:Regular Rate:Normal     Neuro/Psych    GI/Hepatic   Endo/Other    Renal/GU      Musculoskeletal   Abdominal   Peds  Hematology   Anesthesia Other Findings   Reproductive/Obstetrics                             Anesthesia Physical Anesthesia Plan  ASA: III  Anesthesia Plan: General   Post-op Pain Management:    Induction: Intravenous  Airway Management Planned: Oral ETT  Additional Equipment:   Intra-op Plan:   Post-operative Plan: Extubation in OR  Informed Consent: I have reviewed the patients History and Physical, chart, labs and discussed the procedure including the risks, benefits and alternatives for the proposed anesthesia with the patient or authorized representative who has indicated his/her understanding and acceptance.   Dental advisory given  Plan Discussed with: CRNA and Anesthesiologist  Anesthesia Plan Comments:         Anesthesia Quick Evaluation  

## 2017-03-29 NOTE — Progress Notes (Signed)
Subjective: Day of Surgery Procedure(s) (LRB): Microlumbar decompression L3-L4, L4-L5 (N/A) Patient reports pain as 4 on 0-10 scale.   Postop Check check Objective: Vital signs in last 24 hours: Temp:  [97.4 F (36.3 C)-98.5 F (36.9 C)] 98 F (36.7 C) (04/18 1628) Pulse Rate:  [65-77] 71 (04/18 1628) Resp:  [12-19] 15 (04/18 1628) BP: (147-180)/(76-99) 180/80 (04/18 1628) SpO2:  [98 %-100 %] 100 % (04/18 1628) Weight:  [155.1 kg (342 lb)-155.2 kg (342 lb 3 oz)] 155.1 kg (342 lb) (04/18 1420)  Intake/Output from previous day: No intake/output data recorded. Intake/Output this shift: Total I/O In: 2505 [I.V.:2050; IV Piggyback:455] Out: 1050 [Urine:750; Blood:300]  No results for input(s): HGB in the last 72 hours. No results for input(s): WBC, RBC, HCT, PLT in the last 72 hours. No results for input(s): NA, K, CL, CO2, BUN, CREATININE, GLUCOSE, CALCIUM in the last 72 hours. No results for input(s): LABPT, INR in the last 72 hours.  Neurologically intact No Headache Assessment/Plan: Day of Surgery Procedure(s) (LRB): Microlumbar decompression L3-L4, L4-L5 (N/A) No Headache. Motor 5/5.   Vaughan Garfinkle C 03/29/2017, 5:11 PM

## 2017-03-29 NOTE — Anesthesia Procedure Notes (Signed)
Procedure Name: Intubation Date/Time: 03/29/2017 8:41 AM Performed by: Orest Dikes Pre-anesthesia Checklist: Patient identified, Emergency Drugs available, Suction available and Patient being monitored Patient Re-evaluated:Patient Re-evaluated prior to inductionOxygen Delivery Method: Circle system utilized Preoxygenation: Pre-oxygenation with 100% oxygen Intubation Type: IV induction Ventilation: Mask ventilation without difficulty Laryngoscope Size: Glidescope and 4 Grade View: Grade I Tube type: Oral Tube size: 7.5 mm Number of attempts: 1 Airway Equipment and Method: Stylet Placement Confirmation: ETT inserted through vocal cords under direct vision,  positive ETCO2 and breath sounds checked- equal and bilateral Secured at: 22 cm Tube secured with: Tape Dental Injury: Teeth and Oropharynx as per pre-operative assessment  Difficulty Due To: Difficulty was anticipated and Difficult Airway- due to large tongue Comments: Elective use of Glidescope due to previous documentation of Glidescope usage for intubation. Grade 1 view with Glidescope 4, ETT passed with ease.

## 2017-03-29 NOTE — Transfer of Care (Signed)
Immediate Anesthesia Transfer of Care Note  Patient: AMROM ORE  Procedure(s) Performed: Procedure(s): Microlumbar decompression L3-L4, L4-L5 (N/A)  Patient Location: PACU  Anesthesia Type:General  Level of Consciousness:  sedated, patient cooperative and responds to stimulation  Airway & Oxygen Therapy:Patient Spontanous Breathing and Patient connected to face mask oxgen  Post-op Assessment:  Report given to PACU RN and Post -op Vital signs reviewed and stable  Post vital signs:  Reviewed and stable  Last Vitals:  Vitals:   03/29/17 0631 03/29/17 1247  BP: (!) 156/99 (!) 155/83  Pulse: 77 72  Resp: 16 (P) 17  Temp: 36.9 C (P) 36.5 C    Complications: No apparent anesthesia complications

## 2017-03-29 NOTE — Discharge Instructions (Signed)

## 2017-03-29 NOTE — Anesthesia Postprocedure Evaluation (Addendum)
Anesthesia Post Note  Patient: Michael Holland  Procedure(s) Performed: Procedure(s) (LRB): Microlumbar decompression L3-L4, L4-L5 (N/A)  Patient location during evaluation: PACU Anesthesia Type: General Level of consciousness: awake, awake and alert and oriented Pain management: pain level controlled Vital Signs Assessment: post-procedure vital signs reviewed and stable Respiratory status: spontaneous breathing, nonlabored ventilation and respiratory function stable Cardiovascular status: blood pressure returned to baseline Anesthetic complications: no       Last Vitals:  Vitals:   03/29/17 1628 03/29/17 1711  BP: (!) 180/80 (!) 171/86  Pulse: 71 61  Resp: 15 16  Temp: 36.7 C 36.7 C    Last Pain:  Vitals:   03/29/17 1711  TempSrc: Oral  PainSc:                  Davene Jobin COKER

## 2017-03-29 NOTE — Plan of Care (Signed)
Problem: Activity: Goal: Risk for activity intolerance will decrease Outcome: Not Progressing Patient w dura tear  On bedrest/hob flat

## 2017-03-29 NOTE — Progress Notes (Signed)
Dsg assessed by Dr Shelle Iron prior to pacu arrival.

## 2017-03-29 NOTE — Interval H&P Note (Signed)
History and Physical Interval Note:  03/29/2017 8:21 AM  Michael Holland  has presented today for surgery, with the diagnosis of Stenosis, L3-5  The various methods of treatment have been discussed with the patient and family. After consideration of risks, benefits and other options for treatment, the patient has consented to  Procedure(s): Microlumbar decompression L3-L5 (N/A) as a surgical intervention .  The patient's history has been reviewed, patient examined, no change in status, stable for surgery.  I have reviewed the patient's chart and labs.  Questions were answered to the patient's satisfaction.     Oneisha Ammons C

## 2017-03-30 ENCOUNTER — Encounter (HOSPITAL_COMMUNITY): Payer: Self-pay | Admitting: Specialist

## 2017-03-30 DIAGNOSIS — M48061 Spinal stenosis, lumbar region without neurogenic claudication: Secondary | ICD-10-CM | POA: Diagnosis present

## 2017-03-30 DIAGNOSIS — I10 Essential (primary) hypertension: Secondary | ICD-10-CM | POA: Diagnosis present

## 2017-03-30 DIAGNOSIS — Z6841 Body Mass Index (BMI) 40.0 and over, adult: Secondary | ICD-10-CM | POA: Diagnosis not present

## 2017-03-30 DIAGNOSIS — Z79899 Other long term (current) drug therapy: Secondary | ICD-10-CM | POA: Diagnosis not present

## 2017-03-30 DIAGNOSIS — K219 Gastro-esophageal reflux disease without esophagitis: Secondary | ICD-10-CM | POA: Diagnosis present

## 2017-03-30 DIAGNOSIS — G9612 Meningeal adhesions (cerebral) (spinal): Secondary | ICD-10-CM | POA: Diagnosis present

## 2017-03-30 DIAGNOSIS — Y838 Other surgical procedures as the cause of abnormal reaction of the patient, or of later complication, without mention of misadventure at the time of the procedure: Secondary | ICD-10-CM | POA: Diagnosis present

## 2017-03-30 DIAGNOSIS — G9741 Accidental puncture or laceration of dura during a procedure: Secondary | ICD-10-CM | POA: Diagnosis not present

## 2017-03-30 DIAGNOSIS — M1730 Unilateral post-traumatic osteoarthritis, unspecified knee: Secondary | ICD-10-CM | POA: Diagnosis present

## 2017-03-30 DIAGNOSIS — G4730 Sleep apnea, unspecified: Secondary | ICD-10-CM | POA: Diagnosis present

## 2017-03-30 DIAGNOSIS — R066 Hiccough: Secondary | ICD-10-CM | POA: Diagnosis present

## 2017-03-30 DIAGNOSIS — Z9884 Bariatric surgery status: Secondary | ICD-10-CM | POA: Diagnosis not present

## 2017-03-30 LAB — COMPREHENSIVE METABOLIC PANEL
ALT: 25 U/L (ref 17–63)
AST: 58 U/L — ABNORMAL HIGH (ref 15–41)
Albumin: 3.4 g/dL — ABNORMAL LOW (ref 3.5–5.0)
Alkaline Phosphatase: 57 U/L (ref 38–126)
Anion gap: 10 (ref 5–15)
BUN: 21 mg/dL — ABNORMAL HIGH (ref 6–20)
CHLORIDE: 97 mmol/L — AB (ref 101–111)
CO2: 27 mmol/L (ref 22–32)
CREATININE: 1.59 mg/dL — AB (ref 0.61–1.24)
Calcium: 8.8 mg/dL — ABNORMAL LOW (ref 8.9–10.3)
GFR calc Af Amer: 56 mL/min — ABNORMAL LOW (ref >60)
GFR, EST NON AFRICAN AMERICAN: 48 mL/min — AB (ref >60)
Glucose, Bld: 126 mg/dL — ABNORMAL HIGH (ref 65–99)
POTASSIUM: 4.3 mmol/L (ref 3.5–5.1)
Sodium: 134 mmol/L — ABNORMAL LOW (ref 135–145)
TOTAL PROTEIN: 7.9 g/dL (ref 6.5–8.1)
Total Bilirubin: 0.4 mg/dL (ref 0.3–1.2)

## 2017-03-30 LAB — CBC
HEMATOCRIT: 41.8 % (ref 39.0–52.0)
Hemoglobin: 13.8 g/dL (ref 13.0–17.0)
MCH: 28.5 pg (ref 26.0–34.0)
MCHC: 33 g/dL (ref 30.0–36.0)
MCV: 86.2 fL (ref 78.0–100.0)
PLATELETS: 232 10*3/uL (ref 150–400)
RBC: 4.85 MIL/uL (ref 4.22–5.81)
RDW: 14.8 % (ref 11.5–15.5)
WBC: 17.6 10*3/uL — AB (ref 4.0–10.5)

## 2017-03-30 MED ORDER — DIPHENHYDRAMINE HCL 25 MG PO CAPS
25.0000 mg | ORAL_CAPSULE | Freq: Four times a day (QID) | ORAL | Status: DC | PRN
  Administered 2017-03-30: 07:00:00 25 mg via ORAL
  Filled 2017-03-30 (×2): qty 1

## 2017-03-30 MED ORDER — SODIUM CHLORIDE 0.9 % IV SOLN
25.0000 mg | Freq: Four times a day (QID) | INTRAVENOUS | Status: DC | PRN
  Filled 2017-03-30: qty 1

## 2017-03-30 MED ORDER — METOCLOPRAMIDE HCL 5 MG/ML IJ SOLN
10.0000 mg | Freq: Three times a day (TID) | INTRAMUSCULAR | Status: DC | PRN
  Administered 2017-03-30: 10 mg via INTRAVENOUS
  Filled 2017-03-30 (×2): qty 2

## 2017-03-30 MED ORDER — IBUPROFEN 200 MG PO TABS: 400.0000 mg | ORAL_TABLET | Freq: Three times a day (TID) | ORAL | 0 refills | Status: AC | PRN

## 2017-03-30 NOTE — Progress Notes (Signed)
Subjective: 1 Day Post-Op Procedure(s) (LRB): Microlumbar decompression L3-L4, L4-L5 (N/A) Patient reports pain as 4 on 0-10 scale.   No Headache. No leg pain  Objective: Vital signs in last 24 hours: Temp:  [97.4 F (36.3 C)-99.2 F (37.3 C)] 99.2 F (37.3 C) (04/19 0501) Pulse Rate:  [58-73] 62 (04/19 0501) Resp:  [12-19] 16 (04/19 0501) BP: (119-180)/(46-94) 119/46 (04/19 0501) SpO2:  [98 %-100 %] 100 % (04/19 0501) Weight:  [155.1 kg (342 lb)] 155.1 kg (342 lb) (04/18 1420)  Intake/Output from previous day: 04/18 0701 - 04/19 0700 In: 4150 [P.O.:220; I.V.:3275; IV Piggyback:655] Out: 2300 [Urine:2000; Blood:300] Intake/Output this shift: No intake/output data recorded.   Recent Labs  03/30/17 0417  HGB 13.8    Recent Labs  03/30/17 0417  WBC 17.6*  RBC 4.85  HCT 41.8  PLT 232    Recent Labs  03/30/17 0417  NA 134*  K 4.3  CL 97*  CO2 27  BUN 21*  CREATININE 1.59*  GLUCOSE 126*  CALCIUM 8.8*   No results for input(s): LABPT, INR in the last 72 hours.  Neurologically intact Sensation intact distally Intact pulses distally Dorsiflexion/Plantar flexion intact Incision: dressing C/D/I  Assessment/Plan: 1 Day Post-Op Procedure(s) (LRB): Microlumbar decompression L3-L4, L4-L5 (N/A) Advance diet Up with therapy  Discussed OP and dural rent. Raise HOB. If no HA then OOB.  Renji Berwick C 03/30/2017, 7:20 AM

## 2017-03-30 NOTE — Progress Notes (Signed)
Patient  unable to tolerate CPAP. RN aware

## 2017-03-30 NOTE — Evaluation (Signed)
Physical Therapy Evaluation Patient Details Name: Michael Holland MRN: [id number] DOB: 1961/04/07 Today's Date: 03/30/2017   History of Present Illness  Pt s/p L3-5 lumbar decompression with dural tear  Clinical Impression  Pt post back surgery and presenting with functional mobility limitations 2* post op pain and back precautions as well as obesity.  Pt should progress to dc home with family assist.    Follow Up Recommendations No PT follow up    Equipment Recommendations  Rolling walker with 5" wheels (Pt is 340lbs)    Recommendations for Other Services OT consult     Precautions / Restrictions Precautions Precautions: Back;Other (comment) Precaution Comments: Up to chair only this date, defer ambulation to tomorrow per Dr Ermelinda Das order Restrictions Weight Bearing Restrictions: No      Mobility  Bed Mobility Overal bed mobility: Needs Assistance Bed Mobility: Supine to Sit     Supine to sit: Min assist;Mod assist     General bed mobility comments: cues for correct log roll technique and assist to transition to sitting   Transfers Overall transfer level: Needs assistance Equipment used: Rolling walker (2 wheeled) Transfers: Sit to/from Stand Sit to Stand: Min assist         General transfer comment: cues for transition position, adherence to back precautions and use of UEs to self assist  Ambulation/Gait Ambulation/Gait assistance: Min assist Ambulation Distance (Feet): 5 Feet Assistive device: Rolling walker (2 wheeled) Gait Pattern/deviations: Step-to pattern;Decreased step length - right;Decreased step length - left;Shuffle Gait velocity: decr Gait velocity interpretation: Below normal speed for age/gender General Gait Details: cues for posture and position from RW  Stairs            Wheelchair Mobility    Modified Rankin (Stroke Patients Only)       Balance                                             Pertinent  Vitals/Pain Pain Assessment: Faces Faces Pain Scale: Hurts even more Pain Location: back Pain Descriptors / Indicators: Aching;Sore;Grimacing Pain Intervention(s): Limited activity within patient's tolerance;Monitored during session;Patient requesting pain meds-RN notified    Home Living Family/patient expects to be discharged to:: Private residence Living Arrangements: Spouse/significant other Available Help at Discharge: Family Type of Home: House Home Access: Level entry     Home Layout: Two level Home Equipment: None Additional Comments: Pt with level entry to den and single step up to bathroom    Prior Function Level of Independence: Independent               Hand Dominance        Extremity/Trunk Assessment   Upper Extremity Assessment Upper Extremity Assessment: Overall WFL for tasks assessed    Lower Extremity Assessment Lower Extremity Assessment: Overall WFL for tasks assessed       Communication   Communication: No difficulties  Cognition Arousal/Alertness: Awake/alert Behavior During Therapy: WFL for tasks assessed/performed Overall Cognitive Status: Within Functional Limits for tasks assessed                                        General Comments      Exercises     Assessment/Plan    PT Assessment Patient needs continued PT services  PT Problem List  Decreased activity tolerance;Decreased mobility;Decreased knowledge of use of DME;Pain;Obesity       PT Treatment Interventions DME instruction;Gait training;Stair training;Functional mobility training;Therapeutic activities;Therapeutic exercise;Patient/family education    PT Goals (Current goals can be found in the Care Plan section)  Acute Rehab PT Goals Patient Stated Goal: less pain PT Goal Formulation: With patient Time For Goal Achievement: 04/03/17 Potential to Achieve Goals: Good    Frequency Min 6X/week   Barriers to discharge        Co-evaluation                End of Session   Activity Tolerance: Patient limited by pain Patient left: in chair;with call bell/phone within reach Nurse Communication: Mobility status PT Visit Diagnosis: Unsteadiness on feet (R26.81);Difficulty in walking, not elsewhere classified (R26.2);Pain Pain - Right/Left:  (central) Pain - part of body:  (back)    Time: 1526-1600 PT Time Calculation (min) (ACUTE ONLY): 34 min   Charges:   PT Evaluation $PT Eval Low Complexity: 1 Procedure PT Treatments $Therapeutic Activity: 8-22 mins   PT G Codes:        Pg [phone]   Katrese Shell 03/30/2017, 5:31 PM

## 2017-03-30 NOTE — Progress Notes (Signed)
Physical Therapy Treatment Patient Details Name: CODI KERTZ MRN: [id number] DOB: 11-05-1961 Today's Date: 03/30/2017    History of Present Illness Pt s/p L3-5 lumbar decompression with dural tear    PT Comments    Pt assisted back to bed, pain limited but min assist and increased time for all acitivities  Follow Up Recommendations  No PT follow up     Equipment Recommendations  Rolling walker with 5" wheels    Recommendations for Other Services OT consult     Precautions / Restrictions Precautions Precautions: Back;Other (comment) Precaution Comments: Up to chair only this date, defer ambulation to tomorrow per Dr Ermelinda Das order Restrictions Weight Bearing Restrictions: No    Mobility  Bed Mobility Overal bed mobility: Needs Assistance Bed Mobility: Sit to Supine     Supine to sit: Min assist;Mod assist Sit to supine: Min assist   General bed mobility comments: cues for correct log roll technique and assist to manage LEs onto bed  Transfers Overall transfer level: Needs assistance Equipment used: Rolling walker (2 wheeled) Transfers: Sit to/from Stand Sit to Stand: Min assist         General transfer comment: cues for transition position, adherence to back precautions and use of UEs to self assist  Ambulation/Gait Ambulation/Gait assistance: Min assist Ambulation Distance (Feet): 5 Feet Assistive device: Rolling walker (2 wheeled) Gait Pattern/deviations: Step-to pattern;Decreased step length - right;Decreased step length - left;Shuffle Gait velocity: decr Gait velocity interpretation: Below normal speed for age/gender General Gait Details: cues for posture and position from [employer]   Stairs            Wheelchair Mobility    Modified Rankin (Stroke Patients Only)       Balance                                            Cognition Arousal/Alertness: Awake/alert Behavior During Therapy: WFL for tasks  assessed/performed Overall Cognitive Status: Within Functional Limits for tasks assessed                                        Exercises      General Comments        Pertinent Vitals/Pain Pain Assessment: 0-10 Pain Score: 6  Faces Pain Scale: Hurts even more Pain Location: back Pain Descriptors / Indicators: Aching;Sore;Grimacing Pain Intervention(s): Limited activity within patient's tolerance;Monitored during session;Premedicated before session    Home Living Family/patient expects to be discharged to:: Private residence Living Arrangements: Spouse/significant other Available Help at Discharge: Family Type of Home: House Home Access: Level entry   Home Layout: Two level Home Equipment: None Additional Comments: Pt with level entry to den and single step up to bathroom    Prior Function Level of Independence: Independent          PT Goals (current goals can now be found in the care plan section) Acute Rehab PT Goals Patient Stated Goal: less pain PT Goal Formulation: With patient Time For Goal Achievement: 04/03/17 Potential to Achieve Goals: Good Progress towards PT goals: Progressing toward goals    Frequency    Min 6X/week      PT Plan Current plan remains appropriate    Co-evaluation             End  of Session   Activity Tolerance: Patient limited by pain Patient left: in bed;with call bell/phone within reach;with family/visitor present Nurse Communication: Mobility status PT Visit Diagnosis: Unsteadiness on feet (R26.81);Difficulty in walking, not elsewhere classified (R26.2);Pain Pain - Right/Left:  (central) Pain - part of body:  (back)     Time: 1610-9604 PT Time Calculation (min) (ACUTE ONLY): 11 min  Charges:  $Therapeutic Activity: 8-22 mins                    G Codes:       Pg [phone]    Latoya Maulding 03/30/2017, 5:37 PM

## 2017-03-30 NOTE — Progress Notes (Signed)
03/30/17 0145 Nursing Dr Aundria Rud notified of patient c/o of itching. oreder received for benadryl

## 2017-03-31 LAB — BASIC METABOLIC PANEL
ANION GAP: 9 (ref 5–15)
BUN: 29 mg/dL — ABNORMAL HIGH (ref 6–20)
CALCIUM: 8.9 mg/dL (ref 8.9–10.3)
CO2: 28 mmol/L (ref 22–32)
Chloride: 97 mmol/L — ABNORMAL LOW (ref 101–111)
Creatinine, Ser: 1.8 mg/dL — ABNORMAL HIGH (ref 0.61–1.24)
GFR, EST AFRICAN AMERICAN: 48 mL/min — AB (ref >60)
GFR, EST NON AFRICAN AMERICAN: 41 mL/min — AB (ref >60)
Glucose, Bld: 91 mg/dL (ref 65–99)
Potassium: 4 mmol/L (ref 3.5–5.1)
SODIUM: 134 mmol/L — AB (ref 135–145)

## 2017-03-31 MED ORDER — CHLORPROMAZINE HCL 25 MG PO TABS
25.0000 mg | ORAL_TABLET | Freq: Four times a day (QID) | ORAL | Status: DC | PRN
  Administered 2017-03-31: 14:00:00 25 mg via ORAL
  Filled 2017-03-31 (×3): qty 1

## 2017-03-31 MED ORDER — SODIUM CHLORIDE 0.9 % IV SOLN
25.0000 mg | Freq: Four times a day (QID) | INTRAVENOUS | Status: DC | PRN
  Filled 2017-03-31: qty 1

## 2017-03-31 NOTE — Discharge Summary (Signed)
Physician Discharge Summary   Patient ID: Michael Holland MRN: [id number] DOB/AGE: Jun 23, 1961 56 y.o.  Admit date: 03/29/2017 Discharge date: 03/31/2017  Primary Diagnosis:   Stenosis, L3-5  Admission Diagnoses:  Past Medical History:  Diagnosis Date  . GERD (gastroesophageal reflux disease)    occ  . Gout   . H/O gastric bypass   . Hypertension   . Lower back pain   . MRSA (methicillin resistant Staphylococcus aureus) colonization    nasal swab-had skin leasion years ago in [place]  . Sleep apnea    hx pre-gastric bp-lost 100lb   Discharge Diagnoses:   Principal Problem:   Spinal stenosis of lumbar region Active Problems:   Spinal stenosis at L4-L5 level   Lumbar spinal stenosis  Procedure:  Procedure(s) (LRB): Microlumbar decompression L3-L4, L4-L5 (N/A)   Consults: None  HPI:  see H&P    Laboratory Data: Hospital Outpatient Visit on 03/24/2017  Component Date Value Ref Range Status  . Sodium 03/24/2017 139  135 - 145 mmol/L Final  . Potassium 03/24/2017 3.9  3.5 - 5.1 mmol/L Final  . Chloride 03/24/2017 105  101 - 111 mmol/L Final  . CO2 03/24/2017 27  22 - 32 mmol/L Final  . Glucose, Bld 03/24/2017 93  65 - 99 mg/dL Final  . BUN 03/24/2017 21* 6 - 20 mg/dL Final  . Creatinine, Ser 03/24/2017 1.55* 0.61 - 1.24 mg/dL Final  . Calcium 03/24/2017 9.2  8.9 - 10.3 mg/dL Final  . GFR calc non Af Amer 03/24/2017 49* >60 mL/min Final  . GFR calc Af Amer 03/24/2017 57* >60 mL/min Final   Comment: (NOTE) The eGFR has been calculated using the CKD EPI equation. This calculation has not been validated in all clinical situations. eGFR's persistently <60 mL/min signify possible Chronic Kidney Disease.   . Anion gap 03/24/2017 7  5 - 15 Final  . WBC 03/24/2017 8.6  4.0 - 10.5 K/uL Final  . RBC 03/24/2017 4.87  4.22 - 5.81 MIL/uL Final  . Hemoglobin 03/24/2017 13.8  13.0 - 17.0 g/dL Final  . HCT 03/24/2017 41.9  39.0 - 52.0 % Final  . MCV 03/24/2017 86.0  78.0 -  100.0 fL Final  . MCH 03/24/2017 28.3  26.0 - 34.0 pg Final  . MCHC 03/24/2017 32.9  30.0 - 36.0 g/dL Final  . RDW 03/24/2017 15.1  11.5 - 15.5 % Final  . Platelets 03/24/2017 237  150 - 400 K/uL Final  . MRSA, PCR 03/24/2017 NEGATIVE  NEGATIVE Final  . Staphylococcus aureus 03/24/2017 NEGATIVE  NEGATIVE Final   Comment:        The Xpert SA Assay (FDA approved for NASAL specimens in patients over 7 years of age), is one component of a comprehensive surveillance program.  Test performance has been validated by [facility] for patients greater than or equal to 55 year old. It is not intended to diagnose infection nor to guide or monitor treatment.     Recent Labs  03/30/17 0417  HGB 13.8    Recent Labs  03/30/17 0417  WBC 17.6*  RBC 4.85  HCT 41.8  PLT 232    Recent Labs  03/30/17 0417 03/31/17 0411  NA 134* 134*  K 4.3 4.0  CL 97* 97*  CO2 27 28  BUN 21* 29*  CREATININE 1.59* 1.80*  GLUCOSE 126* 91  CALCIUM 8.8* 8.9   No results for input(s): LABPT, INR in the last 72 hours.  X-Rays:Dg Lumbar Spine 2-3 Views  Result  Date: 03/24/2017 CLINICAL DATA:  Right-sided lumbar spine pain radiates to the leg. EXAM: LUMBAR SPINE - 2-3 VIEW COMPARISON:  Radiographs dated 01/23/2017 FINDINGS: There is moderate disc space narrowing at L5-S1, unchanged since the prior study. No other significant abnormality of the lumbar spine. No subluxation. Lateral osteophytes appear to fuse the T12-L1 level. IMPRESSION: Chronic degenerative disc disease at L5-S1. No change since the prior study of 01/23/2017. Electronically Signed   By: Lorriane Shire M.D.   On: 03/24/2017 14:20   Dg Spine Portable 1 View  Result Date: 03/29/2017 CLINICAL DATA:  Intraoperative x-ray prior to lumbar decompression. EXAM: PORTABLE SPINE - 1 VIEW COMPARISON:  None. FINDINGS: Single cross-table lateral x-ray of the lumbar spine. Two metallic instruments are noted posteriorly. Posterior tissue retractors.  Superior most instrument tip projects along the inferior margin of the L3-4 neural foramen. Inferior metallic instrument tip projects along the posterior margin of the superior articulating facet of L5. IMPRESSION: Intraoperative localization as described above. Electronically Signed   By: Kathreen Devoid   On: 03/29/2017 11:02   Dg Spine Portable 1 View  Result Date: 03/29/2017 CLINICAL DATA:  Intraoperative localization for spine surgery. EXAM: PORTABLE SPINE - 1 VIEW COMPARISON:  Earlier film, same date. FINDINGS: Surgical retractors is centered at the L4-5 disc space level. IMPRESSION: Surgical retractor centered at the L4-5 disc space level. Electronically Signed   By: Marijo Sanes M.D.   On: 03/29/2017 09:34   Dg Spine Portable 1 View  Result Date: 03/29/2017 CLINICAL DATA:  Intraoperative localization for lumbar spine surgery EXAM: PORTABLE SPINE - 1 VIEW COMPARISON:  03/24/2017 lumbar spine radiographs FINDINGS: There are 2 posterior approach surgical marking devices, the more superior of which terminates posterior to the L3 spinous process and the more inferior of which terminates posterior to the L5 spinous process. IMPRESSION: Posterior approach surgical marking device positions as detailed. Electronically Signed   By: Ilona Sorrel M.D.   On: 03/29/2017 09:21    EKG: Orders placed or performed during the hospital encounter of 01/23/17  . EKG 12 lead  . EKG 12 lead     Hospital Course: Patient was admitted to [facility] and taken to the OR and underwent the above state procedure without complications.  Patient tolerated the procedure well and was later transferred to the recovery room and then to the orthopaedic floor for postoperative care.  They were given PO and IV analgesics for pain control following their surgery.  They were given 24 hours of postoperative antibiotics. He did experience post-op hiccups which improved with meds. Given repair of dural tear, he was kept on bed  rest with head of bed flat POD0 and we advanced this to sitting in chair at bedside later in the day POD1. On POD2, PT was consulted postop to assist with mobility and transfers.  The patient was allowed to be WBAT with therapy and was taught back precautions. Discharge planning was consulted to help with postop disposition and equipment needs. Patient was seen in rounds daily. He progressed well with PT once he was able to ambulate. They were given discharge instructions and dressing directions.  They were instructed on when to follow up in the office with Dr. Tonita Cong.   Diet: Regular diet Activity:WBAT Follow-up:in 10-14 days Disposition - Home Discharged Condition: good    Allergies as of 03/31/2017   No Known Allergies     Medication List    STOP taking these medications   NON FORMULARY  OVER THE COUNTER MEDICATION   oxyCODONE-acetaminophen 5-325 MG tablet Commonly known as:  PERCOCET/ROXICET Replaced by:  oxyCODONE-acetaminophen 10-325 MG tablet     TAKE these medications   amLODipine 10 MG tablet Commonly known as:  NORVASC Take 10 mg by mouth daily.   bisacodyl 5 MG EC tablet Commonly known as:  DULCOLAX Take 5-10 mg by mouth daily as needed for moderate constipation. DEPENDS ON CONSTIPATION IF TAKES 1 -2 TABLETS   docusate sodium 100 MG capsule Commonly known as:  COLACE Take 1 capsule (100 mg total) by mouth 2 (two) times daily as needed for mild constipation.   ibuprofen 200 MG tablet Commonly known as:  ADVIL,MOTRIN Take 2 tablets (400 mg total) by mouth every 8 (eight) hours as needed for headache (for pain.). May resume 5 days post-op as needed. What changed:  additional instructions   methocarbamol 500 MG tablet Commonly known as:  ROBAXIN Take 1 tablet (500 mg total) by mouth every 6 (six) hours as needed for muscle spasms.   metoprolol 200 MG 24 hr tablet Commonly known as:  TOPROL-XL Take 200 mg by mouth daily.   oxyCODONE-acetaminophen 10-325 MG  tablet Commonly known as:  PERCOCET Take 1-2 tablets by mouth every 4 (four) hours as needed for pain (severe). Replaces:  oxyCODONE-acetaminophen 5-325 MG tablet   polyethylene glycol packet Commonly known as:  MIRALAX / GLYCOLAX Take 17 g by mouth daily.   valsartan-hydrochlorothiazide 320-25 MG tablet Commonly known as:  DIOVAN-HCT Take 1 tablet by mouth daily.      Follow-up Information    BEANE,JEFFREY C, MD Follow up in 2 week(s).   Specialty:  Orthopedic Surgery Contact information: [address] [place] [address] [phone]           Signed: Lacie Draft, PA-C for Dr. Tonita Cong Orthopaedic Surgery 03/31/2017, 8:37 AM

## 2017-03-31 NOTE — Progress Notes (Addendum)
Subjective: 2 Days Post-Op Procedure(s) (LRB): Microlumbar decompression L3-L4, L4-L5 (N/A) repair dural tear Patient reports pain as moderate.   Got up with PT yesterday to walk to the chair and sit in the chair. Did not tolerate well, reports increased pain in his R leg as well as his back while sitting. Still uncomfortable this AM c/o pain. Did not experience a headache while sitting up or standing yesterday. No HA currently, lying flat in bed. Still experiencing hiccups and feels the PO med he was given when he experienced this last year was more effective than the IV he's been receiving.  Objective: Vital signs in last 24 hours: Temp:  [97.6 F (36.4 C)-99.3 F (37.4 C)] 98.5 F (36.9 C) (04/20 0615) Pulse Rate:  [60-73] 72 (04/20 0615) Resp:  [16-18] 16 (04/20 0615) BP: (127-159)/(57-75) 159/71 (04/20 0615) SpO2:  [94 %-100 %] 94 % (04/20 0615)  Intake/Output from previous day: 04/19 0701 - 04/20 0700 In: 1080 [P.O.:1080] Out: 3250 [Urine:3250] Intake/Output this shift: No intake/output data recorded.   Recent Labs  03/30/17 0417  HGB 13.8    Recent Labs  03/30/17 0417  WBC 17.6*  RBC 4.85  HCT 41.8  PLT 232    Recent Labs  03/30/17 0417 03/31/17 0411  NA 134* 134*  K 4.3 4.0  CL 97* 97*  CO2 27 28  BUN 21* 29*  CREATININE 1.59* 1.80*  GLUCOSE 126* 91  CALCIUM 8.8* 8.9   No results for input(s): LABPT, INR in the last 72 hours.  Neurologically intact ABD soft Neurovascular intact Sensation intact distally Intact pulses distally Dorsiflexion/Plantar flexion intact Incision: dressing C/D/I and no drainage No cellulitis present Compartment soft no sign of DVT  Assessment/Plan: 2 Days Post-Op Procedure(s) (LRB): Microlumbar decompression L3-L4, L4-L5 (N/A) Advance diet Up with therapy D/C IV fluids  Continue to advance position and PT. Can gradually elevate head of bed and if he tolerates sitting well (no HA) can continue to advance and get up  to ambulate with PT today PT order placed Will try thorazine for refractory hiccups as discussed with pharmacist yesterday, starting at  q6 and could increase to  if refractory Plan for D/C home when ready (after tolerating PT well) anticipate tomorrow Will discuss with Dr. Elissa Lovett, Dayna Barker. 03/31/2017, 7:54 AM

## 2017-03-31 NOTE — Evaluation (Signed)
Occupational Therapy Evaluation Patient Details Name: Michael Holland MRN: [id number] DOB: 09/18/1961 Today's Date: 03/31/2017    History of Present Illness Pt s/p L3-5 lumbar decompression with dural tear   Clinical Impression   Pt was admitted for the above sx.  He will benefit from one more session of OT prior to discharge home.  Pt did work through pain and stepped over to sink and back to bed.  Goals are for toilet and tub DME transfers/education    Follow Up Recommendations  Supervision/Assistance - 24 hour    Equipment Recommendations  3 in 1 bedside commode (wide)    Recommendations for Other Services       Precautions / Restrictions Precautions Precautions: Back Restrictions Weight Bearing Restrictions: No      Mobility Bed Mobility         Supine to sit: Min assist Sit to supine: Min assist   General bed mobility comments: assist for trunk sitting up; pt used bedrail. Assist for legs for sidelying.  Performed sidelying <>sit.  Pt was on his side  Transfers   Equipment used: Rolling walker (2 wheeled)   Sit to Stand: Min assist         General transfer comment: light assistance to stand.  Educated on hand placement, but pt tended to pull up on walker    Balance                                           ADL either performed or assessed with clinical judgement   ADL Overall ADL's : Needs assistance/impaired     Grooming: Set up;Sitting   Upper Body Bathing: Set up;Sitting   Lower Body Bathing: Maximal assistance;Sit to/from stand   Upper Body Dressing : Minimal assistance;Sitting   Lower Body Dressing: Total assistance;Sit to/from stand                 General ADL Comments: pt has been having adl assistance from wife.  Educated on Sports administrator, which he has, but he said wife will continue to assist him.  Educated on toilet aide options. Wife works nights.  He does have a bidet wand at home.  Set up high BSC but pt did  not want to transfer to this at this time.  He preferred to sit back on high bed.  Showed sidestepping into tub, but pt is not ready for this at this time.  Will return later to show him a tub bench as an option.       Vision         Perception     Praxis      Pertinent Vitals/Pain Pain Score: 10-Worst pain ever Pain Location: back Pain Descriptors / Indicators: Aching;Sore;Grimacing Pain Intervention(s): Limited activity within patient's tolerance;Premedicated before session;Monitored during session;Repositioned     Hand Dominance     Extremity/Trunk Assessment Upper Extremity Assessment Upper Extremity Assessment: Overall WFL for tasks assessed           Communication Communication Communication: No difficulties   Cognition Arousal/Alertness: Awake/alert Behavior During Therapy: WFL for tasks assessed/performed Overall Cognitive Status: Within Functional Limits for tasks assessed                                     General Comments  Exercises     Shoulder Instructions      Home Living Family/patient expects to be discharged to:: Private residence Living Arrangements: Spouse/significant other Available Help at Discharge: Family               Bathroom Shower/Tub: Chief Strategy Officer: Standard     Home Equipment: None          Prior Functioning/Environment Level of Independence: Needs assistance    ADL's / Homemaking Assistance Needed: wife has been helping with ADLs; mostly sponge bathed            OT Problem List: Decreased activity tolerance;Pain;Decreased knowledge of use of DME or AE      OT Treatment/Interventions: Self-care/ADL training;Patient/family education;DME and/or AE instruction    OT Goals(Current goals can be found in the care plan section) Acute Rehab OT Goals Patient Stated Goal: less pain OT Goal Formulation: With patient Time For Goal Achievement: 04/07/17 Potential to Achieve  Goals: Good ADL Goals Pt Will Transfer to Toilet: with min guard assist;ambulating;bedside commode Pt Will Perform Tub/Shower Transfer: Tub transfer;tub bench;ambulating;with min assist (verbalize vs demonstrate)  OT Frequency: Min 2X/week   Barriers to D/C:            Co-evaluation              End of Session    Activity Tolerance: Patient limited by pain Patient left: in bed;with call bell/phone within reach;with family/visitor present  OT Visit Diagnosis: Muscle weakness (generalized) (M62.81);Pain                Time: 7829-5621 OT Time Calculation (min): 34 min Charges:  OT General Charges $OT Visit: 1 Procedure OT Evaluation $OT Eval Low Complexity: 1 Procedure OT Treatments $Therapeutic Activity: 8-22 mins G-Codes:     [place], OTR/L [phone] 03/31/2017  Koehn Salehi 03/31/2017, 9:09 AM

## 2017-03-31 NOTE — Progress Notes (Signed)
Physical Therapy Treatment Patient Details Name: Michael Holland MRN: [id number] DOB: August 22, 1961 Today's Date: 03/31/2017    History of Present Illness Pt s/p L3-5 lumbar decompression with dural tear    PT Comments    Pt pain limited but progressing slowly with mobility and eager for dc home.  Pt requires increased time and intermittent min assist to complete mobility tasks.   Follow Up Recommendations  No PT follow up     Equipment Recommendations  Rolling walker with 5" wheels    Recommendations for Other Services OT consult     Precautions / Restrictions Precautions Precautions: Back Precaution Comments: Pt able to state 3/4 back precautions without cues Restrictions Weight Bearing Restrictions: No    Mobility  Bed Mobility Overal bed mobility: Needs Assistance Bed Mobility: Supine to Sit;Sit to Supine     Supine to sit: Min assist Sit to supine: Min guard   General bed mobility comments: cues for sequence and min assist with trunk to sit up  Transfers Overall transfer level: Needs assistance Equipment used: Rolling walker (2 wheeled) Transfers: Sit to/from Stand Sit to Stand: Min guard         General transfer comment:  Educated on hand placement, but pt tended to pull up on walker  Ambulation/Gait Ambulation/Gait assistance: Min assist;Min guard Ambulation Distance (Feet): 85 Feet Assistive device: Rolling walker (2 wheeled) Gait Pattern/deviations: Step-to pattern;Decreased step length - right;Decreased step length - left;Shuffle Gait velocity: decr Gait velocity interpretation: Below normal speed for age/gender General Gait Details: Increased time and cues for posture and position from RW   Stairs            Wheelchair Mobility    Modified Rankin (Stroke Patients Only)       Balance                                            Cognition Arousal/Alertness: Awake/alert Behavior During Therapy: WFL for tasks  assessed/performed Overall Cognitive Status: Within Functional Limits for tasks assessed                                        Exercises      General Comments        Pertinent Vitals/Pain Pain Assessment: 0-10 Pain Score: 10-Worst pain ever Pain Location: back Pain Descriptors / Indicators: Aching;Sore;Grimacing Pain Intervention(s): Limited activity within patient's tolerance;Monitored during session;Premedicated before session    Home Living                      Prior Function            PT Goals (current goals can now be found in the care plan section) Acute Rehab PT Goals Patient Stated Goal: less pain PT Goal Formulation: With patient Time For Goal Achievement: 04/03/17 Potential to Achieve Goals: Good Progress towards PT goals: Progressing toward goals    Frequency    Min 6X/week      PT Plan Current plan remains appropriate    Co-evaluation             End of Session   Activity Tolerance: Patient limited by pain Patient left: in bed;with call bell/phone within reach;with family/visitor present Nurse Communication: Mobility status PT Visit Diagnosis: Unsteadiness on feet (R26.81);Difficulty  in walking, not elsewhere classified (R26.2);Pain     Time: 1130-1202 PT Time Calculation (min) (ACUTE ONLY): 32 min  Charges:  $Gait Training: 8-22 mins $Therapeutic Activity: 8-22 mins                    G Codes:       Pg [phone]    Michael Holland 03/31/2017, 12:15 PM

## 2017-03-31 NOTE — Progress Notes (Signed)
Pt with need for wide RW and wide 3in1 for home. This CM contacted pt worker's comp case manager Tora Duck [phone]) for DME. DME delivered to room by [facility]. No other CM needs communicated. Sandford Craze RN,BSN,NCM [phone]

## 2017-03-31 NOTE — Progress Notes (Signed)
OT Note:  Stopped by to see if pt was interested in seeing tub bench; he is not. He plans to sponge bathe.  [place], [place] [phone] 03/31/2017

## 2017-03-31 NOTE — Op Note (Signed)
NAME:  Michael Holland, Michael Holland NO.:  MEDICAL RECORD NO.:  [id number]  LOCATION:                                 FACILITY:  PHYSICIAN:  Jene Every, M.D.         DATE OF BIRTH:  DATE OF PROCEDURE:  03/29/2017 DATE OF DISCHARGE:                              OPERATIVE REPORT   PREOPERATIVE DIAGNOSIS:  Spinal stenosis at L3-4 and L4-5, congenital and acquired.  Elevated body mass index of 47.  POSTOPERATIVE DIAGNOSIS:  Spinal stenosis at L3-4 and L4-5, congenital and acquired.  Elevated body mass index of 47.  PROCEDURE PERFORMED: 1. Microlumbar decompression, L3-4, L4-5. 2. Central laminectomy of L4. 3. Foraminotomies at L4-L5 bilaterally. 4. Application of DuraGen to dural rent. Technical difficulty increased due to the patient's elevated BMI, size, BMI of 47.  ANESTHESIA:  General.  ASSISTANT:  Lanna Poche, PA and Georges Lynch. Gioffre, M.D.  BRIEF HISTORY:  This is a 56 year old male, who has had severe right lower extremity radicular pain secondary to spinal stenosis, HNP initially at L3-4 and at L4-5 with severe lateral recess stenosis refractory to conservative treatment over 2 years in duration including epidural steroid injections, activity modification, physical therapy, and analgesics to no avail.  The patient demonstrated predominantly leg pain on the right with weakness in his dorsiflexion in the L5 nerve root distribution in the quadriceps as well.  In the supine position, his MRI indicating multifactorial stenosis predominantly at L4-5, secondarily at L3-4 with a disk herniation.  In the flexion-extension CT myelogram in the [facility], he had a significant narrowing of the lateral recesses bilaterally, right greater than left extending up to the mid pedicle of L4, which was extended into the L3-4 region.  Discussed surgical options initially, which would require decompression at L4-5 and probable removal of the neural arch to extend through the  zone of stenosis and extending into the L3-4.  He initially had comorbidities including obesity and hypertension, both precluded early surgical intervention. He had medical management for his hypertension in 4 years period of time attempts to commit to weight reduction as primary conservative treatment as well as physical therapy.  Given the severity of his pain, the utilization of the cane, remaining out of work, and had refractory pain, we discussed decompression at L3-4 and L4-5.  Risks and benefits discussed including bleeding, infection, damage to neurovascular structures, no change in symptoms, worsening symptoms, CSF leakage, dural rent, persistent in symptoms, need for fusion in the future, etc., DVT, PE, anesthetic complications, etc.  TECHNIQUE:  With the patient in supine position, after induction of adequate general anesthesia and 1.5 g of vancomycin, 3 g of Kefzol for gram-negative coverage, the lumbar region was prepped and draped in usual sterile fashion.  Two 18-gauge spinal needles were utilized to localize the L3-4, L4-5 interspace confirmed with x-ray.  I made an incision from above the spinous process of L4 to below L5.  Subcutaneous tissue was dissected.  Electrocautery was utilized to achieve hemostasis.  He had a fairly ample subcutaneous adipose layer and required a McCulloch retractor to aid visualization to the dorsolumbar fascia.  We infiltrated  that region with 1% lidocaine with epinephrine for hemostasis.  The dorsolumbar fascia was divided on either line of the skin incision paraspinous muscle elevated from lamina of L4-5 and at L3-4.  Operating microscope was draped and brought on the surgical field.  We had utilized an extra long McCulloch retractor and the extra long blade was just to the level of facets; however, I felt there was appropriate visualization at that point.  Confirmatory radiograph obtained with Kocher on the spinous processes of L4 and L5.   We used a Leksell rongeur to remove the spinous process of L4 and L5 as he had a very small interlaminar window.  Centrally, used a micro curette to detach the ligamentum flavum from the cephalad edge of L5 and in the caudad edge of L4.  I then utilized a 2 mm Kerrison centrally and performed hemilaminotomies of L4 bilaterally.  Extending cephalad to detach the ligamentum flavum.  Then detached the ligamentum flavum from the cephalad edge of L5.  He had severe lateral recess stenosis noted bilaterally secondary to facet and ligamentum flavum hypertrophy.  I placed a neuro patty beneath the ligamentum flavum and removed the ligamentum flavum from the interspace centrally.  I felt the stenosis laterally extended up above the neural arch of L4.  I felt for exposure to the lateral recesses and to the central stenosis removal of neural arch was appropriate.  I then removed the neural arch of L4 extending into L3-L4, removed the ligamentum flavum there as well.  Following this, I created a plane between the ligamentum flavum and thecal sac on the left at L3-4 and at L4-5.  Then protecting neural elements, neuro patties and decompressed the lateral recess to the medial border of the pedicle.  In addition, severe stenosis was noted and performed foraminotomy of L4 and of L5.  There was no disk herniation noted there at L4-5.  We then turned our attention to the right at L4-5, and we used to lyse the microcurette.  There were significant adhesions noted to the dorsolateral aspect of the thecal sac and the L5 root.  Meticulously developed a plane beneath thecal sac and the lateral recesses cephalad to the lateral recess as well as caudad.  We continued to develop that plane.  Thecal sac was somewhat attenuated here, the remnants of the steroid injection noted here as well.  I then used a microcurette to begin removing a portion of that medial facet.  Noted concurrently was a period that we began  decompressing the lateral recess with a 2 mm Kerrison protecting the thecal sac and the shoulder of the L5 root. There were adhesions of the facet noted here as well.  After carefully lifting the medial portion of the facet, the rent was encountered at the dorsolateral aspect of the thecal sac approximately a centimeter in length.  I felt there was a foramen medial and caudad was open at L5 as well as L4.  I placed the DuraGen patch after reconstitution with saline over this area.  I obtained confirmatory radiographs of the decompression at L3-4 and at L4-5.  Following the application of DuraGen patch, we placed thrombin-soaked Gelfoam around laminotomy defect.  We had used the bipolar cautery and bone wax throughout the case.  He did have generalized bleeding through the case due to his hypertension.  We optimized that as best possible.  Following that, I evaluated that and no static CSF leaking, therefore placed a Valsalva, Valsalva in the 30- 35, and  there was some fluid noted.  I felt prudent to re-explore this area of the rent.  We then removed the DuraGen patch and then continued laterally, still there were additional adhesions far out laterally and extending down into the L5 foramen with an osteophyte noted there.  With meticulous period, the patch application had folded over at this point prohibiting full adhesion to the rent.  I therefore utilized 2 mm Kerrison and further decompressed that lateral recess to the medial border of the pedicle performed a generous foraminotomy L4 fully freeing up that lateral aspect of the L5 root.  I then reapplied the DuraGen patch after reconstitution, placing it laterally both on the lateral medial side of the rent and cephalad and caudad.  Following this period of time to allow for adhesion, also the patch placed over the remainder of the exposed thecal sac.  We re-Valsalva the patient to 40 mm, and full inspection revealed no CSF leakage.  When  we had explored the lateral aspect of the rent, we felt that due to attenuation of thecal sac.  He has difficulty with that exposure in the depth of his wound to that area precluded expedient suturing of that rent.  Therefore, chose to reapply and reposition the patch.  Next, after copious irrigation, no CSF leakage or active bleeding was noted.  I removed the Veterinary surgeon and the [employer]. Paraspinous muscles inspected no evidence of active bleeding.  Irrigated the dorsolumbar fascia, reapproximated with #1 Vicryl interrupted figure- of-eight suture and then a running Vicryl for a watertight closure, subcu with multiple 2-0 layers to a subcutaneous adipose tissue and the wound was closed with staples.  Next, the patient was placed supine gently on the hospital table, extubated without valsalving and transported to the recovery room in satisfactory condition.  The patient tolerated the procedure well.  No complications.  Blood loss 300 mL.  Assistant, Lanna Poche PA and Dr. Georges Lynch. Gioffre.     Jene Every, M.D.     Cordelia Pen  D:  03/29/2017  T:  03/30/2017  Job:  [id number]

## 2017-08-03 NOTE — Addendum Note (Signed)
Addendum  created 08/03/17 1320 by Ciara Kagan, MD   Sign clinical note    

## 2018-10-04 ENCOUNTER — Encounter (INDEPENDENT_AMBULATORY_CARE_PROVIDER_SITE_OTHER): Payer: Self-pay

## 2018-10-15 ENCOUNTER — Ambulatory Visit (INDEPENDENT_AMBULATORY_CARE_PROVIDER_SITE_OTHER): Payer: Self-pay | Admitting: Family Medicine

## 2018-10-23 DIAGNOSIS — Z0289 Encounter for other administrative examinations: Secondary | ICD-10-CM

## 2018-10-29 ENCOUNTER — Ambulatory Visit (INDEPENDENT_AMBULATORY_CARE_PROVIDER_SITE_OTHER): Payer: Self-pay | Admitting: Family Medicine

## 2018-11-27 ENCOUNTER — Encounter (INDEPENDENT_AMBULATORY_CARE_PROVIDER_SITE_OTHER): Payer: Self-pay

## 2018-12-20 ENCOUNTER — Encounter (INDEPENDENT_AMBULATORY_CARE_PROVIDER_SITE_OTHER): Payer: Self-pay | Admitting: Bariatrics

## 2018-12-20 ENCOUNTER — Ambulatory Visit (INDEPENDENT_AMBULATORY_CARE_PROVIDER_SITE_OTHER): Payer: Commercial Managed Care - PPO | Admitting: Bariatrics

## 2018-12-20 VITALS — BP 142/86 | HR 82 | Temp 97.9°F | Ht 70.0 in | Wt 345.0 lb

## 2018-12-20 DIAGNOSIS — Z9889 Other specified postprocedural states: Secondary | ICD-10-CM

## 2018-12-20 DIAGNOSIS — R0602 Shortness of breath: Secondary | ICD-10-CM | POA: Diagnosis not present

## 2018-12-20 DIAGNOSIS — R5383 Other fatigue: Secondary | ICD-10-CM | POA: Diagnosis not present

## 2018-12-20 DIAGNOSIS — R632 Polyphagia: Secondary | ICD-10-CM

## 2018-12-20 DIAGNOSIS — F3289 Other specified depressive episodes: Secondary | ICD-10-CM | POA: Diagnosis not present

## 2018-12-20 DIAGNOSIS — Z6841 Body Mass Index (BMI) 40.0 and over, adult: Secondary | ICD-10-CM

## 2018-12-20 DIAGNOSIS — I10 Essential (primary) hypertension: Secondary | ICD-10-CM

## 2018-12-20 DIAGNOSIS — Z9189 Other specified personal risk factors, not elsewhere classified: Secondary | ICD-10-CM | POA: Diagnosis not present

## 2018-12-20 DIAGNOSIS — Z8669 Personal history of other diseases of the nervous system and sense organs: Secondary | ICD-10-CM

## 2018-12-21 LAB — CBC WITH DIFFERENTIAL
BASOS ABS: 0 10*3/uL (ref 0.0–0.2)
Basos: 0 %
EOS (ABSOLUTE): 0.1 10*3/uL (ref 0.0–0.4)
Eos: 1 %
Hematocrit: 47.3 % (ref 37.5–51.0)
Hemoglobin: 15.7 g/dL (ref 13.0–17.7)
IMMATURE GRANS (ABS): 0 10*3/uL (ref 0.0–0.1)
IMMATURE GRANULOCYTES: 0 %
LYMPHS: 32 %
Lymphocytes Absolute: 3.2 10*3/uL — ABNORMAL HIGH (ref 0.7–3.1)
MCH: 28.5 pg (ref 26.6–33.0)
MCHC: 33.2 g/dL (ref 31.5–35.7)
MCV: 86 fL (ref 79–97)
MONOCYTES: 10 %
Monocytes Absolute: 1 10*3/uL — ABNORMAL HIGH (ref 0.1–0.9)
NEUTROS PCT: 57 %
Neutrophils Absolute: 5.5 10*3/uL (ref 1.4–7.0)
RBC: 5.5 x10E6/uL (ref 4.14–5.80)
RDW: 14.7 % (ref 11.6–15.4)
WBC: 9.8 10*3/uL (ref 3.4–10.8)

## 2018-12-21 LAB — COMPREHENSIVE METABOLIC PANEL
ALK PHOS: 69 IU/L (ref 39–117)
ALT: 15 IU/L (ref 0–44)
AST: 15 IU/L (ref 0–40)
Albumin/Globulin Ratio: 1.1 — ABNORMAL LOW (ref 1.2–2.2)
Albumin: 4.1 g/dL (ref 3.5–5.5)
BUN/Creatinine Ratio: 13 (ref 9–20)
BUN: 23 mg/dL (ref 6–24)
Bilirubin Total: 0.6 mg/dL (ref 0.0–1.2)
CHLORIDE: 101 mmol/L (ref 96–106)
CO2: 23 mmol/L (ref 20–29)
CREATININE: 1.72 mg/dL — AB (ref 0.76–1.27)
Calcium: 9.3 mg/dL (ref 8.7–10.2)
GFR calc Af Amer: 51 mL/min/{1.73_m2} — ABNORMAL LOW (ref >59)
GFR calc non Af Amer: 44 mL/min/{1.73_m2} — ABNORMAL LOW (ref >59)
GLUCOSE: 89 mg/dL (ref 65–99)
Globulin, Total: 3.9 g/dL (ref 1.5–4.5)
Potassium: 4.3 mmol/L (ref 3.5–5.2)
Sodium: 140 mmol/L (ref 134–144)
TOTAL PROTEIN: 8 g/dL (ref 6.0–8.5)

## 2018-12-21 LAB — HEMOGLOBIN A1C
Est. average glucose Bld gHb Est-mCnc: 120 mg/dL
HEMOGLOBIN A1C: 5.8 % — AB (ref 4.8–5.6)

## 2018-12-21 LAB — LIPID PANEL WITH LDL/HDL RATIO
CHOLESTEROL TOTAL: 248 mg/dL — AB (ref 100–199)
HDL: 46 mg/dL (ref >39)
LDL Calculated: 177 mg/dL — ABNORMAL HIGH (ref 0–99)
LDl/HDL Ratio: 3.8 ratio — ABNORMAL HIGH (ref 0.0–3.6)
Triglycerides: 124 mg/dL (ref 0–149)
VLDL Cholesterol Cal: 25 mg/dL (ref 5–40)

## 2018-12-21 LAB — FOLATE: FOLATE: 9.3 ng/mL (ref >3.0)

## 2018-12-21 LAB — INSULIN, RANDOM: INSULIN: 25.8 u[IU]/mL — AB (ref 2.6–24.9)

## 2018-12-21 LAB — VITAMIN B12: VITAMIN B 12: 441 pg/mL (ref 232–1245)

## 2018-12-21 LAB — T3: T3, Total: 108 ng/dL (ref 71–180)

## 2018-12-21 LAB — T4, FREE: FREE T4: 1.16 ng/dL (ref 0.82–1.77)

## 2018-12-21 LAB — VITAMIN D 25 HYDROXY (VIT D DEFICIENCY, FRACTURES): VIT D 25 HYDROXY: 9.5 ng/mL — AB (ref 30.0–100.0)

## 2018-12-21 LAB — TSH: TSH: 2.46 u[IU]/mL (ref 0.450–4.500)

## 2018-12-24 NOTE — Progress Notes (Signed)
.  Office: [phone]  /  Fax: [phone]   HPI:   Chief Complaint: OBESITY  Michael Holland (MR# [id number]) is a 58 y.o. male who presents on 12/24/2018 for obesity evaluation and treatment. Current BMI is Body mass index is 49.5 kg/m.Michael Holland has struggled with obesity for years and has been unsuccessful in either losing weight or maintaining long term weight loss. Michael Holland is seeing an orthopedist for radiating pain going down his right leg, and he needs surgery; but he needs to get his BMI down to 40. Michael Holland attended our information session and states he is currently in the action stage of change and ready to dedicate time achieving and maintaining a healthier weight.  Michael Holland states his family eats meals together his desired weight loss is 115 lbs he has been heavy most of his life he started gaining weight between the age of 23 to 45 yrs old his heaviest weight ever was 375 lbs. he does crave carbohydrates and sweets he snacks frequently in the evenings he skips breakfast and lunch frequently he is frequently drinking liquids with calories he has binge eating behaviors he struggles with emotional eating   History of Gastric Sleeve (2011) Michael Holland has a history of gastric sleeve done with [place]. His highest weight was at 375 pounds and his lowest weight was at 240 pounds.  Resolution: hypertension and obstructive sleep apnea   Fatigue Michael Holland feels his energy is lower than it should be. This has worsened with weight gain and has not worsened recently. Michael Holland admits to daytime somnolence and he admits to waking up still tired. Patient is at risk for obstructive sleep apnea. Patent has a history of symptoms of daytime fatigue, morning fatigue and hypertension. Patient generally gets 2 hours of sleep per night, and states they generally have restless sleep. Snoring is present. Apneic episodes are present. Epworth Sleepiness Score is 20  Dyspnea on exertion Michael Holland notes increasing  shortness of breath with certain activities (climbing stairs) and seems to be worsening over time with weight gain. He notes getting out of breath sooner with activity than he used to. This has not gotten worse recently. Michael Holland has pain with walking. Michael Holland denies orthopnea.  Hypertension MAVERYK MYUNG is a 58 y.o. male with hypertension. He is currently taking Valsartan-HCTZ, Metoprolol and Amlodipine. His blood pressure is not that well controlled today and he did not take his medications this morning. [name] denies chest pain. He is working weight loss to help control his blood pressure with the goal of decreasing his risk of heart attack and stroke. [name] blood pressure is not currently controlled.  Elevated Glucose Dmitrius has a history of some elevated blood glucose readings without a diagnosis of diabetes. He denies excessive appetite.  At risk for diabetes Xavious is at higher than average risk for developing diabetes due to his obesity and elevated glucose. He currently admits polyuria and denies polydipsia.  History of OSA (obstructive sleep apnea) Michael Holland had a positive sleep test in 2010. He admits to fatigue. Michael Holland refuses CPAP at this time.  Depression with emotional eating behaviors Michael Holland is struggling with emotional eating and using food for comfort to the extent that it is negatively impacting his health. He often snacks when he is not hungry. Michael Holland sometimes feels he is out of control and then feels guilty that he made poor food choices. He is attempting to work on behavior modification techniques to help reduce his emotional eating. He shows no sign of  suicidal or homicidal ideations.  Depression Screen Michael Holland's Food and Mood (modified PHQ-9) score was  Depression screen PHQ 2/9 12/20/2018  Decreased Interest 3  Down, Depressed, Hopeless 3  PHQ - 2 Score 6  Altered sleeping 3  Tired, decreased energy 3  Change in appetite 0  Feeling bad or failure about  yourself  2  Trouble concentrating 2  Moving slowly or fidgety/restless 2  Suicidal thoughts 2  PHQ-9 Score 20  Difficult doing work/chores Somewhat difficult    ASSESSMENT AND PLAN:  Other fatigue - Plan: EKG 12-Lead, Vitamin B12, CBC With Differential, Comprehensive metabolic panel, Hemoglobin A1c, Insulin, random, T3, T4, free, TSH, VITAMIN D 25 Hydroxy (Vit-D Deficiency, Fractures), Folate  Shortness of breath on exertion - Plan: Lipid Panel With LDL/HDL Ratio  Increased appetite  Essential hypertension - Plan: Comprehensive metabolic panel  History of obstructive sleep apnea  History of gastric surgery  Other depression - with emotional eating  At risk for diabetes mellitus  Class 3 severe obesity with serious comorbidity and body mass index (BMI) of 45.0 to 49.9 in adult, unspecified obesity type (HCC)  PLAN:  Fatigue Michael Holland was informed that his fatigue may be related to obesity, depression or many other causes. Labs will be ordered, and in the meanwhile Michael Holland has agreed to work on diet, exercise and weight loss to help with fatigue. Proper sleep hygiene was discussed including the need for 7-8 hours of quality sleep each night. A sleep study was not ordered based on symptoms and Epworth score.  Dyspnea on exertion Caton's shortness of breath appears to be obesity related and exercise induced. He has agreed to work on weight loss and gradually increase exercise to treat his exercise induced shortness of breath. If Michael Holland follows our instructions and loses weight without improvement of his shortness of breath, we will plan to refer to pulmonology. We will monitor this condition regularly. [name] agrees to this plan.  Hypertension We discussed sodium restriction, working on healthy weight loss, and a regular exercise program as the means to achieve improved blood pressure control. Michael Holland agreed with this plan and agreed to follow up as directed. We will continue to  monitor his blood pressure as well as his progress with the above lifestyle modifications. He will continue his medications as prescribed and will watch for signs of hypotension as he continues his lifestyle modifications.  Elevated Glucose Fasting labs (Hgb A1c and insulin level) will be obtained and results with be discussed with Michael Holland in 2 weeks at his follow up visit. In the meanwhile Miley was started on a lower simple carbohydrate diet and will work on weight loss efforts.  Diabetes risk counseling Savon was given extended (15 minutes) diabetes prevention counseling today. He is 58 y.o. male and has risk factors for diabetes including obesity and elevated glucose. We discussed intensive lifestyle modifications today with an emphasis on weight loss as well as increasing exercise and decreasing simple carbohydrates in his diet.  History of OSA (obstructive sleep apnea) We discussed the importance of wearing CPAP if one has obstructive sleep apnea. Patient refuses CPAP at this time.  History of Gastric Sleeve (2011) Labs were done today. Jamall agreed to start diet and work on healthy weight loss. He agrees to follow up with our clinic in 2 weeks.  Depression with Emotional Eating Behaviors We discussed behavior modification techniques today to help Abhinav deal with his emotional eating and depression. He will consider Dr. Dewaine Conger in the future if his insurance  will approve. Michael Dredgedward agreed to follow up with our clinic in 2 weeks.  Depression Screen Michael Dredgedward had a strongly positive depression screening. Depression is commonly associated with obesity and often results in emotional eating behaviors. We will monitor this closely and work on CBT to help improve the non-hunger eating patterns. Referral to Psychology may be required if no improvement is seen as he continues in our clinic.  Obesity Michael Dredgedward is currently in the action stage of change and his goal is to continue with weight loss  efforts He has agreed to follow the Category 4 plan Michael Dredgedward has been instructed to work up to a goal of 150 minutes of combined cardio and strengthening exercise per week for weight loss and overall health benefits. We discussed the following Behavioral Modification Strategies today: increase H2O intake, keeping healthy foods in the home, increasing lean protein intake, decreasing simple carbohydrates, increasing vegetables and work on meal planning and easy cooking plans  Michael Dredgedward has agreed to follow up with our clinic in 2 weeks. He was informed of the importance of frequent follow up visits to maximize his success with intensive lifestyle modifications for his multiple health conditions. He was informed we would discuss his lab results at his next visit unless there is a critical issue that needs to be addressed sooner. Michael Dredgedward agreed to keep his next visit at the agreed upon time to discuss these results.  ALLERGIES: No Known Allergies  MEDICATIONS: Current Outpatient Medications on File Prior to Visit  Medication Sig Dispense Refill  . amLODipine (NORVASC) 10 MG tablet Take 10 mg by mouth daily.    . bisacodyl (DULCOLAX) 5 MG EC tablet Take 5-10 mg by mouth daily as needed for moderate constipation. DEPENDS ON CONSTIPATION IF TAKES 1 -2 TABLETS    . docusate sodium (COLACE) 100 MG capsule Take 1 capsule (100 mg total) by mouth 2 (two) times daily as needed for mild constipation. 30 capsule 1  . ibuprofen (ADVIL,MOTRIN) 200 MG tablet Take 2 tablets (400 mg total) by mouth every 8 (eight) hours as needed for headache (for pain.). May resume 5 days post-op as needed. 30 tablet 0  . methocarbamol (ROBAXIN) 500 MG tablet Take 1 tablet (500 mg total) by mouth every 6 (six) hours as needed for muscle spasms. 40 tablet 1  . metoprolol (TOPROL-XL) 200 MG 24 hr tablet Take 200 mg by mouth daily.  1  . oxyCODONE-acetaminophen (PERCOCET) 10-325 MG tablet Take 1-2 tablets by mouth every 4 (four) hours as  needed for pain (severe). 40 tablet 0  . polyethylene glycol (MIRALAX / GLYCOLAX) packet Take 17 g by mouth daily. 14 each 0  . valsartan-hydrochlorothiazide (DIOVAN-HCT) 320-25 MG tablet Take 1 tablet by mouth daily.     No current facility-administered medications on file prior to visit.     PAST MEDICAL HISTORY: Past Medical History:  Diagnosis Date  . Anxiety   . Asthma   . Depression   . GERD (gastroesophageal reflux disease)    occ  . Gout   . H/O gastric bypass   . High cholesterol   . Hypertension   . Joint pain   . Lower back pain   . MRSA (methicillin resistant Staphylococcus aureus) colonization    nasal swab[place]  . Shortness of breath   . Sleep apnea    hx pre-gastric bp-lost 100lb  . Swelling of both lower extremities     PAST SURGICAL HISTORY: Past Surgical History:  Procedure  Laterality Date  . ACHILLES TENDON SURGERY  6/13   fx foot  . EXCISION HAGLUND'S DEFORMITY WITH ACHILLES TENDON REPAIR Right 08/22/2013   Procedure: RIGHT ACHILLES DEBRIDEMENT/GASTROC RECESSION/HAGLUND EXCISION RIGHT FOOT AND REMOVAL PAINFUL HARDWARE RIGHT FOOT ;  Surgeon: Toni Arthurs, MD;  Location: [facility];  Service: Orthopedics;  Laterality: Right;  . GASTRIC BYPASS OPEN  2010   [place]  . HARDWARE REMOVAL Left 08/22/2013   Procedure: LEFT KNEE PAINFUL HARDWARE REMOVAL;  Surgeon: Toni Arthurs, MD;  Location: [facility];  Service: Orthopedics;  Laterality: Left;  . HERNIA REPAIR  2008   umb hernia-DSC  . KNEE RECONSTRUCTION  6/13   post fx-left=[place]  . LUMBAR LAMINECTOMY/DECOMPRESSION MICRODISCECTOMY N/A 03/29/2017   Procedure: Microlumbar decompression L3-L4, L4-L5;  Surgeon: Jene Every, MD;  Location: [facility];  Service: Orthopedics;  Laterality: N/A;    SOCIAL HISTORY: Social History   Tobacco Use  . Smoking status: Never Smoker  . Smokeless tobacco: Never Used  Substance Use Topics  .  Alcohol use: Yes    Comment: occ  . Drug use: No    FAMILY HISTORY: Family History  Problem Relation Age of Onset  . Diabetes Mother   . High blood pressure Mother   . High Cholesterol Mother   . Eating disorder Mother   . Obesity Mother   . Diabetes Father   . High blood pressure Father   . High Cholesterol Father     ROS: Review of Systems  Constitutional: Positive for malaise/fatigue.  Eyes:       + Wear Glasses or Contacts  Respiratory: Positive for cough and shortness of breath (on exertion).        + Sudden Awakening from Sleep with Shortness of Breath  Cardiovascular: Negative for orthopnea.       + Calf/Leg Pain with Walking + Very Cold Feet or Hands  Gastrointestinal: Positive for constipation.  Genitourinary: Positive for frequency.  Musculoskeletal: Positive for back pain and neck pain.       + Neck Stiffness + Muscle Stiffness + Muscle or Joint Pain  Endo/Heme/Allergies: Negative for polydipsia.       + Excessive Hunger  Psychiatric/Behavioral: Positive for depression. The patient is nervous/anxious and has insomnia.        + Stress    PHYSICAL EXAM: Blood pressure (!) 142/86, pulse 82, temperature 97.9 F (36.6 C), temperature source Oral, height 5\' 10"  (1.778 m), weight (!) 345 lb (156.5 kg), SpO2 93 %. Body mass index is 49.5 kg/m. Physical Exam Vitals signs reviewed.  Constitutional:      Appearance: He is well-developed.  HENT:     Head: Normocephalic and atraumatic.     Nose: Nose normal.     Mouth/Throat:     Comments: Mallampati = 3 Neck:     Musculoskeletal: Normal range of motion and neck supple.     Thyroid: No thyromegaly.  Cardiovascular:     Rate and Rhythm: Normal rate and regular rhythm.  Pulmonary:     Effort: Pulmonary effort is normal. No respiratory distress.  Abdominal:     Palpations: Abdomen is soft.     Tenderness: There is no abdominal tenderness.  Musculoskeletal: Normal range of motion.     Right lower leg: Edema  (trace) present.     Left lower leg: Edema (trace) present.     Comments: Range of Motion normal in all 4 extremities  Skin:    General: Skin  is warm and dry.  Neurological:     Mental Status: He is alert and oriented to person, place, and time.  Psychiatric:        Behavior: Behavior normal.        Thought Content: Thought content does not include homicidal or suicidal ideation.     RECENT LABS AND TESTS: BMET    Component Value Date/Time   NA 140 12/20/2018 1155   K 4.3 12/20/2018 1155   CL 101 12/20/2018 1155   CO2 23 12/20/2018 1155   GLUCOSE 89 12/20/2018 1155   GLUCOSE 91 03/31/2017 0411   BUN 23 12/20/2018 1155   CREATININE 1.72 (H) 12/20/2018 1155   CALCIUM 9.3 12/20/2018 1155   GFRNONAA 44 (L) 12/20/2018 1155   GFRAA 51 (L) 12/20/2018 1155   Lab Results  Component Value Date   HGBA1C 5.8 (H) 12/20/2018   Lab Results  Component Value Date   INSULIN 25.8 (H) 12/20/2018   CBC    Component Value Date/Time   WBC 9.8 12/20/2018 1155   WBC 17.6 (H) 03/30/2017 0417   RBC 5.50 12/20/2018 1155   RBC 4.85 03/30/2017 0417   HGB 15.7 12/20/2018 1155   HCT 47.3 12/20/2018 1155   PLT 232 03/30/2017 0417   MCV 86 12/20/2018 1155   MCH 28.5 12/20/2018 1155   MCH 28.5 03/30/2017 0417   MCHC 33.2 12/20/2018 1155   MCHC 33.0 03/30/2017 0417   RDW 14.7 12/20/2018 1155   LYMPHSABS 3.2 (H) 12/20/2018 1155   EOSABS 0.1 12/20/2018 1155   BASOSABS 0.0 12/20/2018 1155   Iron/TIBC/Ferritin/ %Sat No results found for: IRON, TIBC, FERRITIN, IRONPCTSAT Lipid Panel     Component Value Date/Time   CHOL 248 (H) 12/20/2018 1155   TRIG 124 12/20/2018 1155   HDL 46 12/20/2018 1155   LDLCALC 177 (H) 12/20/2018 1155   Hepatic Function Panel     Component Value Date/Time   PROT 8.0 12/20/2018 1155   ALBUMIN 4.1 12/20/2018 1155   AST 15 12/20/2018 1155   ALT 15 12/20/2018 1155   ALKPHOS 69 12/20/2018 1155   BILITOT 0.6 12/20/2018 1155      Component Value Date/Time    TSH 2.460 12/20/2018 1155   Vitamin D There are no recent lab results  ECG  shows NSR with a rate of 84 BPM INDIRECT CALORIMETER done today shows a VO2 of 346 and a REE of 2411. His calculated basal metabolic rate is [id number] thus his basal metabolic rate is worse than expected.       OBESITY BEHAVIORAL INTERVENTION VISIT  Today's visit was # 1   Starting weight: 345 lbs Starting date: 12/20/2018 Today's weight : 345 lbs Today's date: 12/20/2018 Total lbs lost to date: 0   ASK: We discussed the diagnosis of obesity with Simona HuhEdward G Rudell today and Michael DredgeEdward agreed to give us permission to discuss obesity behavioral modification therapy today.  ASSESS: Michael Dredgedward has the diagnosis of obesity and his BMI today is 49.5 Michael Dredgedward is in the action stage of change   ADVISE: Michael Dredgedward was educated on the multiple health risks of obesity as well as the benefit of weight loss to improve his health. He was advised of the need for long term treatment and the importance of lifestyle modifications to improve his current health and to decrease his risk of future health problems.  AGREE: Multiple dietary modification options and treatment options were discussed and  Michael Dredgedward agreed to follow the recommendations documented in the above note.  ARRANGE: Kyley was educated on the importance of frequent visits to treat obesity as outlined per CMS and USPSTF guidelines and agreed to schedule his next follow up appointment today.   Cristi Loron, am acting as Energy manager for [employer]. Manson Passey, DO  I have reviewed the above documentation for accuracy and completeness, and I agree with the above. -Corinna Capra, DO

## 2018-12-25 DIAGNOSIS — E785 Hyperlipidemia, unspecified: Secondary | ICD-10-CM | POA: Diagnosis not present

## 2018-12-25 DIAGNOSIS — Z8669 Personal history of other diseases of the nervous system and sense organs: Secondary | ICD-10-CM | POA: Insufficient documentation

## 2018-12-25 DIAGNOSIS — I10 Essential (primary) hypertension: Secondary | ICD-10-CM | POA: Diagnosis not present

## 2018-12-25 DIAGNOSIS — Z8739 Personal history of other diseases of the musculoskeletal system and connective tissue: Secondary | ICD-10-CM | POA: Diagnosis not present

## 2018-12-25 DIAGNOSIS — Z9889 Other specified postprocedural states: Secondary | ICD-10-CM | POA: Insufficient documentation

## 2019-01-03 ENCOUNTER — Ambulatory Visit (INDEPENDENT_AMBULATORY_CARE_PROVIDER_SITE_OTHER): Payer: Commercial Managed Care - PPO | Admitting: Bariatrics

## 2019-01-03 ENCOUNTER — Encounter (INDEPENDENT_AMBULATORY_CARE_PROVIDER_SITE_OTHER): Payer: Self-pay | Admitting: Bariatrics

## 2019-01-03 VITALS — BP 119/73 | HR 69 | Temp 97.5°F | Ht 70.0 in | Wt 341.0 lb

## 2019-01-03 DIAGNOSIS — E7849 Other hyperlipidemia: Secondary | ICD-10-CM

## 2019-01-03 DIAGNOSIS — N183 Chronic kidney disease, stage 3 unspecified: Secondary | ICD-10-CM

## 2019-01-03 DIAGNOSIS — R7303 Prediabetes: Secondary | ICD-10-CM | POA: Diagnosis not present

## 2019-01-03 DIAGNOSIS — E559 Vitamin D deficiency, unspecified: Secondary | ICD-10-CM | POA: Diagnosis not present

## 2019-01-03 DIAGNOSIS — Z9884 Bariatric surgery status: Secondary | ICD-10-CM | POA: Diagnosis not present

## 2019-01-03 DIAGNOSIS — Z6841 Body Mass Index (BMI) 40.0 and over, adult: Secondary | ICD-10-CM

## 2019-01-03 DIAGNOSIS — Z9189 Other specified personal risk factors, not elsewhere classified: Secondary | ICD-10-CM

## 2019-01-03 MED ORDER — VITAMIN D (ERGOCALCIFEROL) 1.25 MG (50000 UNIT) PO CAPS: 50000.0000 [IU] | ORAL_CAPSULE | ORAL | 0 refills | Status: DC

## 2019-01-07 NOTE — Progress Notes (Signed)
Office: [phone]  /  Fax: [phone]   HPI:   Chief Complaint: OBESITY Michael Holland is here to discuss his progress with his obesity treatment plan. He is on the Category 4 plan and is following his eating plan approximately 65 % of the time. He states he is exercising 0 minutes 0 times per week. [place] been doing well, except he had to eat out two times for birthdays. He did not struggle with the plan. Michael Holland has not been hungry. His weight is (!) 341 lb (154.7 kg) today and has had a weight loss of 4 pounds over a period of 2 weeks since his last visit. He has lost 4 lbs since starting treatment with [place].  History of Gastric Sleeve Michael Holland has a history of gastric sleeve that was done in [place]. He had resolution or hypertension and obstructive sleep apnea.  Vitamin D deficiency Michael Holland has a diagnosis of vitamin D deficiency. His last vitamin D level was at 9.5 He is not currently taking vit D and denies nausea, vomiting or muscle weakness.  Pre-Diabetes Michael Holland has a diagnosis of prediabetes based on his elevated Hgb A1c and was informed this puts him at greater risk of developing diabetes. He is not taking metformin currently and continues to work on diet and exercise to decrease risk of diabetes.   At risk for diabetes Michael Holland is at higher than average risk for developing diabetes due to his obesity and prediabetes. He currently denies polyuria or polydipsia.  Hyperlipidemia Michael Holland has hyperlipidemia. His last LDL was at 177 and total cholesterol was at 248. He is currently taking Atorvastatin and Fenofibrate (just started). He has been trying to improve his cholesterol levels with intensive lifestyle modification including a low saturated fat diet, exercise and weight loss. He denies any chest pain, claudication or myalgias.  CKD (chronic kidney disease) Stage 3 Michael Holland has a diagnosis of chronic kidney disease stage 3 and he has been stable since 2018.  ASSESSMENT AND  PLAN:  Vitamin D deficiency - Plan: Vitamin D, Ergocalciferol, (DRISDOL) 1.25 MG (50000 UT) CAPS capsule  Prediabetes  Other hyperlipidemia  Status post laparoscopic sleeve gastrectomy  Stage 3 chronic kidney disease (HCC)  At risk for diabetes mellitus  Class 3 severe obesity with serious comorbidity and body mass index (BMI) of 45.0 to 49.9 in adult, unspecified obesity type (HCC)  PLAN:  Vitamin D Deficiency Michael Holland was informed that low vitamin D levels contributes to fatigue and are associated with obesity, breast, and colon cancer. He agrees to continue to take prescription Vit D @50 ,000 IU every week #4 with no refills and will follow up for routine testing of vitamin D, at least 2-3 times per year. He was informed of the risk of over-replacement of vitamin D and agrees to not increase his dose unless he discusses this with [place] first. Michael Holland agrees to follow up as directed.  Pre-Diabetes Michael Holland will continue to work on weight loss, exercise, increasing lean protein and decreasing simple carbohydrates in his diet to help decrease the risk of diabetes. He was informed that eating too many simple carbohydrates or too many calories at one sitting increases the likelihood of GI side effects. Michael Holland agreed to follow up with [place] as directed to monitor his progress.  Diabetes risk counseling Michael Holland was given extended (15 minutes) diabetes prevention counseling today. He is 58 y.o. male and has risk factors for diabetes including obesity and prediabetes. We discussed intensive lifestyle modifications today with an emphasis on weight loss  as well as increasing exercise and decreasing simple carbohydrates in his diet.  Hyperlipidemia Michael Holland was informed of the American Heart Association Guidelines emphasizing intensive lifestyle modifications as the first line treatment for hyperlipidemia. We discussed many lifestyle modifications today in depth, and Michael Holland will continue to work on decreasing  saturated fats such as fatty red meat, butter and many fried foods. He will also increase vegetables and lean protein in his diet and continue to work on exercise and weight loss efforts. Michael Holland will follow up with his PCP.  CKD (chronic kidney disease) Stage 3 Michael Holland will avoid NSAIDs and he will keep his blood pressure controlled. He will work on decreasing sodium and he will stay well hydrated.  History of Gastric Sleeve Michael Holland agrees to take OTC [place] vitamins daily and follow up at the agreed upon time.  Obesity Michael Holland is currently in the action stage of change. As such, his goal is to continue with weight loss efforts He has agreed to follow the Category 4 plan Michael Holland has been instructed to work up to a goal of 150 minutes of combined cardio and strengthening exercise per week for weight loss and overall health benefits. We discussed the following Behavioral Modification Strategies today: increase H2O intake, keeping healthy foods in the home, better snacking choices, increasing lean protein intake, decreasing simple carbohydrates, increasing vegetables, decreasing sodium intake, decrease eating out and work on meal planning and easy cooking plans  Michael Holland has agreed to follow up with our clinic in 2 weeks. He was informed of the importance of frequent follow up visits to maximize his success with intensive lifestyle modifications for his multiple health conditions.  ALLERGIES: No Known Allergies  MEDICATIONS: Current Outpatient Medications on File Prior to Visit  Medication Sig Dispense Refill  . amLODipine (NORVASC) 10 MG tablet Take 10 mg by mouth daily.    Marland Kitchen atorvastatin (LIPITOR) 20 MG tablet Take 20 mg by mouth daily.    . bisacodyl (DULCOLAX) 5 MG EC tablet Take 5-10 mg by mouth daily as needed for moderate constipation. DEPENDS ON CONSTIPATION IF TAKES 1 -2 TABLETS    . Celery Seed OIL by Does not apply route.    . docusate sodium (COLACE) 100 MG capsule Take 1 capsule  (100 mg total) by mouth 2 (two) times daily as needed for mild constipation. 30 capsule 1  . fenofibrate 160 MG tablet Take 160 mg by mouth daily.    Marland Kitchen ibuprofen (ADVIL,MOTRIN) 200 MG tablet Take 2 tablets (400 mg total) by mouth every 8 (eight) hours as needed for headache (for pain.). May resume 5 days post-op as needed. 30 tablet 0  . methocarbamol (ROBAXIN) 500 MG tablet Take 1 tablet (500 mg total) by mouth every 6 (six) hours as needed for muscle spasms. 40 tablet 1  . metoprolol (TOPROL-XL) 200 MG 24 hr tablet Take 200 mg by mouth daily.  1  . oxyCODONE-acetaminophen (PERCOCET) 10-325 MG tablet Take 1-2 tablets by mouth every 4 (four) hours as needed for pain (severe). 40 tablet 0  . polyethylene glycol (MIRALAX / GLYCOLAX) packet Take 17 g by mouth daily. 14 each 0  . valsartan-hydrochlorothiazide (DIOVAN-HCT) 320-25 MG tablet Take 1 tablet by mouth daily.     No current facility-administered medications on file prior to visit.     PAST MEDICAL HISTORY: Past Medical History:  Diagnosis Date  . Anxiety   . Asthma   . Depression   . GERD (gastroesophageal reflux disease)    occ  .  Gout   . H/O gastric bypass   . High cholesterol   . Hypertension   . Joint pain   . Lower back pain   . MRSA (methicillin resistant Staphylococcus aureus) colonization    nasal swab-had skin leasion years ago in [place]  . Shortness of breath   . Sleep apnea    hx pre-gastric bp-lost 100lb  . Swelling of both lower extremities     PAST SURGICAL HISTORY: Past Surgical History:  Procedure Laterality Date  . ACHILLES TENDON SURGERY  6/13   fx foot  . EXCISION HAGLUND'S DEFORMITY WITH ACHILLES TENDON REPAIR Right 08/22/2013   Procedure: RIGHT ACHILLES DEBRIDEMENT/GASTROC RECESSION/HAGLUND EXCISION RIGHT FOOT AND REMOVAL PAINFUL HARDWARE RIGHT FOOT ;  Surgeon: Toni Arthurs, MD;  Location: [facility];  Service: Orthopedics;  Laterality: Right;  . GASTRIC BYPASS OPEN  2010   sleeve-in  quate- [place]  . HARDWARE REMOVAL Left 08/22/2013   Procedure: LEFT KNEE PAINFUL HARDWARE REMOVAL;  Surgeon: Toni Arthurs, MD;  Location: [facility];  Service: Orthopedics;  Laterality: Left;  . HERNIA REPAIR  2008   umb hernia-DSC  . KNEE RECONSTRUCTION  6/13   post fx-left=[place]  . LUMBAR LAMINECTOMY/DECOMPRESSION MICRODISCECTOMY N/A 03/29/2017   Procedure: Microlumbar decompression L3-L4, L4-L5;  Surgeon: Jene Every, MD;  Location: [facility];  Service: Orthopedics;  Laterality: N/A;    SOCIAL HISTORY: Social History   Tobacco Use  . Smoking status: Never Smoker  . Smokeless tobacco: Never Used  Substance Use Topics  . Alcohol use: Yes    Comment: occ  . Drug use: No    FAMILY HISTORY: Family History  Problem Relation Age of Onset  . Diabetes Mother   . High blood pressure Mother   . High Cholesterol Mother   . Eating disorder Mother   . Obesity Mother   . Diabetes Father   . High blood pressure Father   . High Cholesterol Father     ROS: Review of Systems  Constitutional: Positive for weight loss.  Cardiovascular: Negative for chest pain and claudication.  Gastrointestinal: Negative for nausea and vomiting.  Genitourinary: Negative for frequency.  Musculoskeletal: Negative for myalgias.       Negative for muscle weakness  Endo/Heme/Allergies: Negative for polydipsia.       Negative for polyphagia    PHYSICAL EXAM: Blood pressure 119/73, pulse 69, temperature (!) 97.5 F (36.4 C), temperature source Oral, height 5\' 10"  (1.778 m), weight (!) 341 lb (154.7 kg), SpO2 95 %. Body mass index is 48.93 kg/m. Physical Exam Vitals signs reviewed.  Constitutional:      Appearance: Normal appearance. He is well-developed. He is obese.  Cardiovascular:     Rate and Rhythm: Normal rate.  Pulmonary:     Effort: Pulmonary effort is normal.  Musculoskeletal: Normal range of motion.  Skin:    General: Skin is warm and dry.  Neurological:     Mental  Status: He is alert and oriented to person, place, and time.  Psychiatric:        Mood and Affect: Mood normal.        Behavior: Behavior normal.     RECENT LABS AND TESTS: BMET    Component Value Date/Time   NA 140 12/20/2018 1155   K 4.3 12/20/2018 1155   CL 101 12/20/2018 1155   CO2 23 12/20/2018 1155   GLUCOSE 89 12/20/2018 1155   GLUCOSE 91 03/31/2017 0411   BUN 23 12/20/2018 1155  CREATININE 1.72 (H) 12/20/2018 1155   CALCIUM 9.3 12/20/2018 1155   GFRNONAA 44 (L) 12/20/2018 1155   GFRAA 51 (L) 12/20/2018 1155   Lab Results  Component Value Date   HGBA1C 5.8 (H) 12/20/2018   Lab Results  Component Value Date   INSULIN 25.8 (H) 12/20/2018   CBC    Component Value Date/Time   WBC 9.8 12/20/2018 1155   WBC 17.6 (H) 03/30/2017 0417   RBC 5.50 12/20/2018 1155   RBC 4.85 03/30/2017 0417   HGB 15.7 12/20/2018 1155   HCT 47.3 12/20/2018 1155   PLT 232 03/30/2017 0417   MCV 86 12/20/2018 1155   MCH 28.5 12/20/2018 1155   MCH 28.5 03/30/2017 0417   MCHC 33.2 12/20/2018 1155   MCHC 33.0 03/30/2017 0417   RDW 14.7 12/20/2018 1155   LYMPHSABS 3.2 (H) 12/20/2018 1155   EOSABS 0.1 12/20/2018 1155   BASOSABS 0.0 12/20/2018 1155   Iron/TIBC/Ferritin/ %Sat No results found for: IRON, TIBC, FERRITIN, IRONPCTSAT Lipid Panel     Component Value Date/Time   CHOL 248 (H) 12/20/2018 1155   TRIG 124 12/20/2018 1155   HDL 46 12/20/2018 1155   LDLCALC 177 (H) 12/20/2018 1155   Hepatic Function Panel     Component Value Date/Time   PROT 8.0 12/20/2018 1155   ALBUMIN 4.1 12/20/2018 1155   AST 15 12/20/2018 1155   ALT 15 12/20/2018 1155   ALKPHOS 69 12/20/2018 1155   BILITOT 0.6 12/20/2018 1155      Component Value Date/Time   TSH 2.460 12/20/2018 1155    Ref. Range 12/20/2018 11:55  Vitamin D, 25-Hydroxy Latest Ref Range: 30.0 - 100.0 ng/mL 9.5 (L)     OBESITY BEHAVIORAL INTERVENTION VISIT  Today's visit was # 2   Starting weight: 345 lbs Starting date:  12/20/2018 Today's weight : 341 lbs Today's date: 01/03/2019 Total lbs lost to date: 4   ASK: We discussed the diagnosis of obesity with Michael Holland today and Michael Holland agreed to give us permission to discuss obesity behavioral modification therapy today.  ASSESS: Michael Holland has the diagnosis of obesity and his BMI today is 2548.93 Michael Holland is in the action stage of change   ADVISE: Michael Holland was educated on the multiple health risks of obesity as well as the benefit of weight loss to improve his health. He was advised of the need for long term treatment and the importance of lifestyle modifications to improve his current health and to decrease his risk of future health problems.  AGREE: Multiple dietary modification options and treatment options were discussed and  Michael Holland agreed to follow the recommendations documented in the above note.  ARRANGE: Michael Holland was educated on the importance of frequent visits to treat obesity as outlined per CMS and USPSTF guidelines and agreed to schedule his next follow up appointment today.  Cristi LoronI, Joanne Michael Holland, am acting as Energy managertranscriptionist for [employer] Corporationngel A. Manson PasseyBrown, DO  I have reviewed the above documentation for accuracy and completeness, and I agree with the above. -Corinna CapraAngel Isauro Skelley, DO

## 2019-01-08 DIAGNOSIS — N183 Chronic kidney disease, stage 3 unspecified: Secondary | ICD-10-CM | POA: Insufficient documentation

## 2019-01-08 DIAGNOSIS — R7303 Prediabetes: Secondary | ICD-10-CM | POA: Insufficient documentation

## 2019-01-16 ENCOUNTER — Encounter (INDEPENDENT_AMBULATORY_CARE_PROVIDER_SITE_OTHER): Payer: Self-pay | Admitting: Physician Assistant

## 2019-01-16 ENCOUNTER — Ambulatory Visit (INDEPENDENT_AMBULATORY_CARE_PROVIDER_SITE_OTHER): Payer: Commercial Managed Care - PPO | Admitting: Physician Assistant

## 2019-01-16 VITALS — BP 144/88 | HR 82 | Temp 97.8°F | Ht 70.0 in | Wt 354.0 lb

## 2019-01-16 DIAGNOSIS — E7849 Other hyperlipidemia: Secondary | ICD-10-CM

## 2019-01-16 DIAGNOSIS — Z6841 Body Mass Index (BMI) 40.0 and over, adult: Secondary | ICD-10-CM

## 2019-01-16 NOTE — Progress Notes (Signed)
Office: [phone]  /  Fax: [phone]   HPI:   Chief Complaint: OBESITY Michael Holland is here to discuss his progress with his obesity treatment plan. He is on the Category 4 plan and is following his eating plan approximately 65% of the time. He states he is exercising 0 minutes 0 times per week. Michael Holland reports that he has not been getting in all of his food daily. He wants to start walking in the water for exercise. His weight is (!) 354 lb (160.6 kg) today and has had a weight gain of 13 pounds since his last visit. He has lost 0 lbs since starting treatment with us.  Hyperlipidemia Michael Holland has hyperlipidemia and has been trying to improve his cholesterol levels with intensive lifestyle modification including a low saturated fat diet, exercise and weight loss. Michael Holland is on atorvastatin and fenofibrate. His last level was negative. He denies any chest pain.  ASSESSMENT AND PLAN:  Other hyperlipidemia  Class 3 severe obesity with serious comorbidity and body mass index (BMI) of 50.0 to 59.9 in adult, unspecified obesity type (HCC)  PLAN:  Hyperlipidemia Michael Holland was informed of the American Heart Association Guidelines emphasizing intensive lifestyle modifications as the first line treatment for hyperlipidemia. We discussed many lifestyle modifications today in depth, and Michael Holland will continue to work on decreasing saturated fats such as fatty red meat, butter and many fried foods. He will also increase vegetables and lean protein in his diet and continue to work on exercise, continue medications, and weight loss efforts.  I spent > than 50% of the 15 minute visit on counseling as documented in the note.  Obesity Michael Holland is currently in the action stage of change. As such, his goal is to continue with weight loss efforts. He has agreed to follow the Category 4 plan. Michael Holland has been instructed to work up to a goal of 150 minutes of combined cardio and strengthening exercise per week for  weight loss and overall health benefits. We discussed the following Behavioral Modification Stratagies today: increasing lean protein intake and work on meal planning and easy cooking plans.  Michael Holland has agreed to follow up with our clinic in 2 weeks. He was informed of the importance of frequent follow up visits to maximize his success with intensive lifestyle modifications for his multiple health conditions.  ALLERGIES: No Known Allergies  MEDICATIONS: Current Outpatient Medications on File Prior to Visit  Medication Sig Dispense Refill  . amLODipine (NORVASC) 10 MG tablet Take 10 mg by mouth daily.    Marland Kitchen. atorvastatin (LIPITOR) 20 MG tablet Take 20 mg by mouth daily.    . bisacodyl (DULCOLAX) 5 MG EC tablet Take 5-10 mg by mouth daily as needed for moderate constipation. DEPENDS ON CONSTIPATION IF TAKES 1 -2 TABLETS    . Celery Seed OIL by Does not apply route.    . docusate sodium (COLACE) 100 MG capsule Take 1 capsule (100 mg total) by mouth 2 (two) times daily as needed for mild constipation. 30 capsule 1  . fenofibrate 160 MG tablet Take 160 mg by mouth daily.    Marland Kitchen. ibuprofen (ADVIL,MOTRIN) 200 MG tablet Take 2 tablets (400 mg total) by mouth every 8 (eight) hours as needed for headache (for pain.). May resume 5 days post-op as needed. 30 tablet 0  . methocarbamol (ROBAXIN) 500 MG tablet Take 1 tablet (500 mg total) by mouth every 6 (six) hours as needed for muscle spasms. 40 tablet 1  . metoprolol (TOPROL-XL) 200 MG 24  hr tablet Take 200 mg by mouth daily.  1  . oxyCODONE-acetaminophen (PERCOCET) 10-325 MG tablet Take 1-2 tablets by mouth every 4 (four) hours as needed for pain (severe). 40 tablet 0  . polyethylene glycol (MIRALAX / GLYCOLAX) packet Take 17 Holland by mouth daily. 14 each 0  . valsartan-hydrochlorothiazide (DIOVAN-HCT) 320-25 MG tablet Take 1 tablet by mouth daily.    . Vitamin D, Ergocalciferol, (DRISDOL) 1.25 MG (50000 UT) CAPS capsule Take 1 capsule (50,000 Units total) by  mouth every 7 (seven) days. 4 capsule 0   No current facility-administered medications on file prior to visit.     PAST MEDICAL HISTORY: Past Medical History:  Diagnosis Date  . Anxiety   . Asthma   . Depression   . GERD (gastroesophageal reflux disease)    occ  . Gout   . H/O gastric bypass   . High cholesterol   . Hypertension   . Joint pain   . Lower back pain   . MRSA (methicillin resistant Staphylococcus aureus) colonization    nasal swab-had skin leasion years ago in [place]  . Shortness of breath   . Sleep apnea    hx pre-gastric bp-lost 100lb  . Swelling of both lower extremities     PAST SURGICAL HISTORY: Past Surgical History:  Procedure Laterality Date  . ACHILLES TENDON SURGERY  6/13   fx foot  . EXCISION HAGLUND'S DEFORMITY WITH ACHILLES TENDON REPAIR Right 08/22/2013   Procedure: RIGHT ACHILLES DEBRIDEMENT/GASTROC RECESSION/HAGLUND EXCISION RIGHT FOOT AND REMOVAL PAINFUL HARDWARE RIGHT FOOT ;  Surgeon: Toni Arthurs, MD;  Location: [facility];  Service: Orthopedics;  Laterality: Right;  . GASTRIC BYPASS OPEN  2010   sleeve-in quate- [place]  . HARDWARE REMOVAL Left 08/22/2013   Procedure: LEFT KNEE PAINFUL HARDWARE REMOVAL;  Surgeon: Toni Arthurs, MD;  Location: [facility];  Service: Orthopedics;  Laterality: Left;  . HERNIA REPAIR  2008   umb hernia-DSC  . KNEE RECONSTRUCTION  6/13   post fx-left=[place]  . LUMBAR LAMINECTOMY/DECOMPRESSION MICRODISCECTOMY N/A 03/29/2017   Procedure: Microlumbar decompression L3-L4, L4-L5;  Surgeon: Jene Every, MD;  Location: [facility];  Service: Orthopedics;  Laterality: N/A;    SOCIAL HISTORY: Social History   Tobacco Use  . Smoking status: Never Smoker  . Smokeless tobacco: Never Used  Substance Use Topics  . Alcohol use: Yes    Comment: occ  . Drug use: No    FAMILY HISTORY: Family History  Problem Relation Age of Onset  . Diabetes Mother   . High blood pressure Mother   . High  Cholesterol Mother   . Eating disorder Mother   . Obesity Mother   . Diabetes Father   . High blood pressure Father   . High Cholesterol Father    ROS: Review of Systems  Constitutional: Negative for weight loss.  Cardiovascular: Negative for chest pain.  Endo/Heme/Allergies:       Negative hypoglycemia   PHYSICAL EXAM: Blood pressure (!) 144/88, pulse 82, temperature 97.8 F (36.6 C), temperature source Oral, height 5\' 10"  (1.778 m), weight (!) 354 lb (160.6 kg), SpO2 94 %. Body mass index is 50.79 kg/m. Physical Exam Vitals signs reviewed.  Constitutional:      Appearance: Normal appearance. He is obese.  Cardiovascular:     Rate and Rhythm: Normal rate.     Pulses: Normal pulses.  Pulmonary:     Effort: Pulmonary effort is normal.     Breath sounds: Normal  breath sounds.  Musculoskeletal: Normal range of motion.  Skin:    General: Skin is warm and dry.  Neurological:     Mental Status: He is alert and oriented to person, place, and time.  Psychiatric:        Mood and Affect: Mood normal.        Behavior: Behavior normal.   RECENT LABS AND TESTS: BMET    Component Value Date/Time   NA 140 12/20/2018 1155   K 4.3 12/20/2018 1155   CL 101 12/20/2018 1155   CO2 23 12/20/2018 1155   GLUCOSE 89 12/20/2018 1155   GLUCOSE 91 03/31/2017 0411   BUN 23 12/20/2018 1155   CREATININE 1.72 (H) 12/20/2018 1155   CALCIUM 9.3 12/20/2018 1155   GFRNONAA 44 (L) 12/20/2018 1155   GFRAA 51 (L) 12/20/2018 1155   Lab Results  Component Value Date   HGBA1C 5.8 (H) 12/20/2018   Lab Results  Component Value Date   INSULIN 25.8 (H) 12/20/2018   CBC    Component Value Date/Time   WBC 9.8 12/20/2018 1155   WBC 17.6 (H) 03/30/2017 0417   RBC 5.50 12/20/2018 1155   RBC 4.85 03/30/2017 0417   HGB 15.7 12/20/2018 1155   HCT 47.3 12/20/2018 1155   PLT 232 03/30/2017 0417   MCV 86 12/20/2018 1155   MCH 28.5 12/20/2018 1155   MCH 28.5 03/30/2017 0417   MCHC 33.2 12/20/2018  1155   MCHC 33.0 03/30/2017 0417   RDW 14.7 12/20/2018 1155   LYMPHSABS 3.2 (H) 12/20/2018 1155   EOSABS 0.1 12/20/2018 1155   BASOSABS 0.0 12/20/2018 1155   Iron/TIBC/Ferritin/ %Sat No results found for: IRON, TIBC, FERRITIN, IRONPCTSAT Lipid Panel     Component Value Date/Time   CHOL 248 (H) 12/20/2018 1155   TRIG 124 12/20/2018 1155   HDL 46 12/20/2018 1155   LDLCALC 177 (H) 12/20/2018 1155   Hepatic Function Panel     Component Value Date/Time   PROT 8.0 12/20/2018 1155   ALBUMIN 4.1 12/20/2018 1155   AST 15 12/20/2018 1155   ALT 15 12/20/2018 1155   ALKPHOS 69 12/20/2018 1155   BILITOT 0.6 12/20/2018 1155      Component Value Date/Time   TSH 2.460 12/20/2018 1155   Results for Michael HuhGEDDIE, Michael Holland (MRN [id number]) as of 01/16/2019 17:07  Ref. Range 12/20/2018 11:55  Vitamin D, 25-Hydroxy Latest Ref Range: 30.0 - 100.0 ng/mL 9.5 (L)   OBESITY BEHAVIORAL INTERVENTION VISIT  Today's visit was #3   Starting weight: 345 lbs Starting date: 12/20/2018 Today's weight: 354 lbs  Today's date: 01/16/2019 Total lbs lost to date: 0  ASK: We discussed the diagnosis of obesity with Michael Holland today and Michael DredgeEdward agreed to give us permission to discuss obesity behavioral modification therapy today.  ASSESS: Michael Holland has the diagnosis of obesity and his BMI today is @ 50.79. Michael Holland is in the action stage of change.  ADVISE: Michael Holland was educated on the multiple health risks of obesity as well as the benefit of weight loss to improve his health. He was advised of the need for long term treatment and the importance of lifestyle modifications to improve his current health and to decrease his risk of future health problems.  AGREE: Multiple dietary modification options and treatment options were discussed and  Michael Holland agreed to follow the recommendations documented in the above note.  ARRANGE: Michael Holland was educated on the importance of frequent visits to treat obesity as outlined per CMS  and USPSTF guidelines and agreed to schedule his next follow up appointment today.  Fernanda Drum, am acting as transcriptionist for Alois Cliche, PA-C I, Alois Cliche, PA-C have reviewed above note and agree with its content

## 2019-01-31 ENCOUNTER — Ambulatory Visit (INDEPENDENT_AMBULATORY_CARE_PROVIDER_SITE_OTHER): Payer: Commercial Managed Care - PPO | Admitting: Bariatrics

## 2019-02-06 ENCOUNTER — Encounter (INDEPENDENT_AMBULATORY_CARE_PROVIDER_SITE_OTHER): Payer: Self-pay | Admitting: Bariatrics

## 2019-02-06 ENCOUNTER — Ambulatory Visit (INDEPENDENT_AMBULATORY_CARE_PROVIDER_SITE_OTHER): Payer: Commercial Managed Care - PPO | Admitting: Bariatrics

## 2019-02-06 VITALS — BP 154/91 | HR 95 | Temp 98.2°F | Ht 70.0 in | Wt 338.0 lb

## 2019-02-06 DIAGNOSIS — Z6841 Body Mass Index (BMI) 40.0 and over, adult: Secondary | ICD-10-CM

## 2019-02-06 DIAGNOSIS — E559 Vitamin D deficiency, unspecified: Secondary | ICD-10-CM

## 2019-02-06 DIAGNOSIS — Z9189 Other specified personal risk factors, not elsewhere classified: Secondary | ICD-10-CM | POA: Diagnosis not present

## 2019-02-06 DIAGNOSIS — Z9884 Bariatric surgery status: Secondary | ICD-10-CM | POA: Diagnosis not present

## 2019-02-06 DIAGNOSIS — I10 Essential (primary) hypertension: Secondary | ICD-10-CM | POA: Diagnosis not present

## 2019-02-06 MED ORDER — VITAMIN D (ERGOCALCIFEROL) 1.25 MG (50000 UNIT) PO CAPS: 50000.0000 [IU] | ORAL_CAPSULE | ORAL | 0 refills | Status: DC

## 2019-02-06 NOTE — Progress Notes (Signed)
Office: [phone]  /  Fax: [phone]   HPI:   Chief Complaint: OBESITY Michael Holland is here to discuss his progress with his obesity treatment plan. He is on the Category 4 plan and is following his eating plan approximately 72 % of the time. He states he is exercising 0 minutes 0 times per week. Michael Holland is going well. He has done especially well over the last two weeks. His weight is (!) 338 lb (153.3 kg) today and has had a weight loss of 16 pounds over a period of 3 weeks since his last visit. He has lost 7 lbs since starting treatment with [place].  Vitamin D deficiency Michael Holland has a diagnosis of vitamin D deficiency. His last vitamin D level was low at 9.5 He is currently taking vit D and denies nausea, vomiting or muscle weakness.  At risk for osteopenia and osteoporosis Michael Holland is at higher risk of osteopenia and osteoporosis due to vitamin D deficiency.   History of Gastric Bypass Michael Holland has a history of gastric bypass and he is currently taking multi-vitamin and prescription Vitamin D.  Hypertension Michael Holland is a 58 y.o. male with hypertension. He is currently taking Amlodipine, Metoprolol and Valsartan-HCTZ. Michael Holland admits chest pain today and gout. He is working weight loss to help control his blood pressure with the goal of decreasing his risk of heart attack and stroke. Michael Holland blood pressure is not well controlled; however, he is having a gout attack in his lower extremity which may be falsely elevating his blood pressure.   ASSESSMENT AND PLAN:  Vitamin D deficiency - Plan: Vitamin D, Ergocalciferol, (DRISDOL) 1.25 MG (50000 UT) CAPS capsule  Essential hypertension  Status post gastric bypass for obesity  At risk for osteoporosis  Class 3 severe obesity with serious comorbidity and body mass index (BMI) of 45.0 to 49.9 in adult, unspecified obesity type (HCC)  PLAN:  Vitamin D Deficiency Michael Holland was informed that low vitamin D levels contributes to  fatigue and are associated with obesity, breast, and colon cancer. He agrees to continue to take prescription Vit D @50 ,000 IU every week #4 with no refills and will follow up for routine testing of vitamin D, at least 2-3 times per year. He was informed of the risk of over-replacement of vitamin D and agrees to not increase his dose unless he discusses this with [place] first.  At risk for osteopenia and osteoporosis Michael Holland was given extended  (15 minutes) osteoporosis prevention counseling today. [name] is at risk for osteopenia and osteoporosis due to his vitamin D deficiency. He was encouraged to take his vitamin D and follow his higher calcium diet and increase strengthening exercise to help strengthen his bones and decrease his risk of osteopenia and osteoporosis.  History of Gastric Bypass Michael Holland will continue to take his vitamins and will follow up with our clinic in 3 weeks.  Hypertension Michael Holland will check his blood pressure on a regular basis, and take his medications every day. He was made aware that weight loss may be beneficial in better controlling his blood pressure.   Obesity Michael Holland is currently in the action stage of change. As such, his goal is to continue with weight loss efforts He has agreed to follow the Category 4 plan Michael Holland has been instructed to work up to a goal of 150 minutes of combined cardio and strengthening exercise per week for weight loss and overall health benefits. We discussed the following Behavioral Modification Strategies today: increase H2O intake,  no skipping meals, increasing lean protein intake (she will ensure to get adequate protein each day), decreasing simple carbohydrates, increasing vegetables and work on meal planning and easy cooking plans  Michael Holland has agreed to follow up with our clinic in 3 weeks. He was informed of the importance of frequent follow up visits to maximize his success with intensive lifestyle modifications for his multiple health  conditions.  ALLERGIES: No Known Allergies  MEDICATIONS: Current Outpatient Medications on File Prior to Visit  Medication Sig Dispense Refill  . amLODipine (NORVASC) 10 MG tablet Take 10 mg by mouth daily.    Michael Holland atorvastatin (LIPITOR) 20 MG tablet Take 20 mg by mouth daily.    . bisacodyl (DULCOLAX) 5 MG EC tablet Take 5-10 mg by mouth daily as needed for moderate constipation. DEPENDS ON CONSTIPATION IF TAKES 1 -2 TABLETS    . Celery Seed OIL by Does not apply route.    . docusate sodium (COLACE) 100 MG capsule Take 1 capsule (100 mg total) by mouth 2 (two) times daily as needed for mild constipation. 30 capsule 1  . fenofibrate 160 MG tablet Take 160 mg by mouth daily.    Michael Holland ibuprofen (ADVIL,MOTRIN) 200 MG tablet Take 2 tablets (400 mg total) by mouth every 8 (eight) hours as needed for headache (for pain.). May resume 5 days post-op as needed. 30 tablet 0  . methocarbamol (ROBAXIN) 500 MG tablet Take 1 tablet (500 mg total) by mouth every 6 (six) hours as needed for muscle spasms. 40 tablet 1  . metoprolol (TOPROL-XL) 200 MG 24 hr tablet Take 200 mg by mouth daily.  1  . oxyCODONE-acetaminophen (PERCOCET) 10-325 MG tablet Take 1-2 tablets by mouth every 4 (four) hours as needed for pain (severe). 40 tablet 0  . polyethylene glycol (MIRALAX / GLYCOLAX) packet Take 17 g by mouth daily. 14 each 0  . valsartan-hydrochlorothiazide (DIOVAN-HCT) 320-25 MG tablet Take 1 tablet by mouth daily.     No current facility-administered medications on file prior to visit.     PAST MEDICAL HISTORY: Past Medical History:  Diagnosis Date  . Anxiety   . Asthma   . Depression   . GERD (gastroesophageal reflux disease)    occ  . Gout   . H/O gastric bypass   . High cholesterol   . Hypertension   . Joint pain   . Lower back pain   . MRSA (methicillin resistant Staphylococcus aureus) colonization    nasal swab-had skin leasion years ago in [place]  . Shortness of breath   . Sleep apnea    hx  pre-gastric bp-lost 100lb  . Swelling of both lower extremities     PAST SURGICAL HISTORY: Past Surgical History:  Procedure Laterality Date  . ACHILLES TENDON SURGERY  6/13   fx foot  . EXCISION HAGLUND'S DEFORMITY WITH ACHILLES TENDON REPAIR Right 08/22/2013   Procedure: RIGHT ACHILLES DEBRIDEMENT/GASTROC RECESSION/HAGLUND EXCISION RIGHT FOOT AND REMOVAL PAINFUL HARDWARE RIGHT FOOT ;  Surgeon: Toni Arthurs, MD;  Location: [facility];  Service: Orthopedics;  Laterality: Right;  . GASTRIC BYPASS OPEN  2010   sleeve-in quate- over seas  . HARDWARE REMOVAL Left 08/22/2013   Procedure: LEFT KNEE PAINFUL HARDWARE REMOVAL;  Surgeon: Toni Arthurs, MD;  Location: [facility];  Service: Orthopedics;  Laterality: Left;  . HERNIA REPAIR  2008   umb hernia-DSC  . KNEE RECONSTRUCTION  6/13   post fx-left=over seas  . LUMBAR LAMINECTOMY/DECOMPRESSION MICRODISCECTOMY N/A 03/29/2017  Procedure: Microlumbar decompression L3-L4, L4-L5;  Surgeon: Jene Every, MD;  Location: [facility];  Service: Orthopedics;  Laterality: N/A;    SOCIAL HISTORY: Social History   Tobacco Use  . Smoking status: Never Smoker  . Smokeless tobacco: Never Used  Substance Use Topics  . Alcohol use: Yes    Comment: occ  . Drug use: No    FAMILY HISTORY: Family History  Problem Relation Age of Onset  . Diabetes Mother   . High blood pressure Mother   . High Cholesterol Mother   . Eating disorder Mother   . Obesity Mother   . Diabetes Father   . High blood pressure Father   . High Cholesterol Father     ROS: Review of Systems  Constitutional: Positive for weight loss.  Cardiovascular: Positive for chest pain.  Gastrointestinal: Negative for nausea and vomiting.  Musculoskeletal:       Negative for muscle weakness    PHYSICAL EXAM: Blood pressure (!) 154/91, pulse 95, temperature 98.2 F (36.8 C), temperature source Oral, height  (1.778 m), weight (!) 338 lb (153.3 kg), SpO2  95 %. Body mass index is 48.5 kg/m. Physical Exam Vitals signs reviewed.  Constitutional:      Appearance: Normal appearance. He is well-developed. He is obese.  Cardiovascular:     Rate and Rhythm: Normal rate.  Pulmonary:     Effort: Pulmonary effort is normal.  Musculoskeletal: Normal range of motion.  Skin:    General: Skin is warm and dry.  Neurological:     Mental Status: He is alert and oriented to person, place, and time.  Psychiatric:        Mood and Affect: Mood normal.        Behavior: Behavior normal.     RECENT LABS AND TESTS: BMET    Component Value Date/Time   NA 140 12/20/2018 1155   K 4.3 12/20/2018 1155   CL 101 12/20/2018 1155   CO2 23 12/20/2018 1155   GLUCOSE 89 12/20/2018 1155   GLUCOSE 91 03/31/2017 0411   BUN 23 12/20/2018 1155   CREATININE 1.72 (H) 12/20/2018 1155   CALCIUM 9.3 12/20/2018 1155   GFRNONAA 44 (L) 12/20/2018 1155   GFRAA 51 (L) 12/20/2018 1155   Lab Results  Component Value Date   HGBA1C 5.8 (H) 12/20/2018   Lab Results  Component Value Date   INSULIN 25.8 (H) 12/20/2018   CBC    Component Value Date/Time   WBC 9.8 12/20/2018 1155   WBC 17.6 (H) 03/30/2017 0417   RBC 5.50 12/20/2018 1155   RBC 4.85 03/30/2017 0417   HGB 15.7 12/20/2018 1155   HCT 47.3 12/20/2018 1155   PLT 232 03/30/2017 0417   MCV 86 12/20/2018 1155   MCH 28.5 12/20/2018 1155   MCH 28.5 03/30/2017 0417   MCHC 33.2 12/20/2018 1155   MCHC 33.0 03/30/2017 0417   RDW 14.7 12/20/2018 1155   LYMPHSABS 3.2 (H) 12/20/2018 1155   EOSABS 0.1 12/20/2018 1155   BASOSABS 0.0 12/20/2018 1155   Iron/TIBC/Ferritin/ %Sat No results found for: IRON, TIBC, FERRITIN, IRONPCTSAT Lipid Panel     Component Value Date/Time   CHOL 248 (H) 12/20/2018 1155   TRIG 124 12/20/2018 1155   HDL 46 12/20/2018 1155   LDLCALC 177 (H) 12/20/2018 1155   Hepatic Function Panel     Component Value Date/Time   PROT 8.0 12/20/2018 1155   ALBUMIN 4.1 12/20/2018 1155   AST  15 12/20/2018 1155  ALT 15 12/20/2018 1155   ALKPHOS 69 12/20/2018 1155   BILITOT 0.6 12/20/2018 1155      Component Value Date/Time   TSH 2.460 12/20/2018 1155   Results for HARRIS, CAPELLAN (MRN [id number]) as of 02/06/2019 15:42  Ref. Range 12/20/2018 11:55  Vitamin D, 25-Hydroxy Latest Ref Range: 30.0 - 100.0 ng/mL 9.5 (L)    OBESITY BEHAVIORAL INTERVENTION VISIT  Today's visit was # 4   Starting weight: 345 lbs Starting date: 12/20/2018 Today's weight : 338 lbs Today's date: 02/06/2019 Total lbs lost to date: 7    02/06/2019  Height 5\' 10"  (1.778 m)  Weight 338 lb (153.3 kg) (A)  BMI (Calculated) 48.5  BLOOD PRESSURE - SYSTOLIC 154  BLOOD PRESSURE - DIASTOLIC 91   Body Fat % 41.8 %  Total Body Water (lbs) 140 lbs    ASK: We discussed the diagnosis of obesity with Michael Holland today and Ramon Dredge agreed to give [place] permission to discuss obesity behavioral modification therapy today.  ASSESS: Ashvik has the diagnosis of obesity and his BMI today is 32.5 Grayson is in the action stage of change   ADVISE: Blayton was educated on the multiple health risks of obesity as well as the benefit of weight loss to improve his health. He was advised of the need for long term treatment and the importance of lifestyle modifications to improve his current health and to decrease his risk of future health problems.  AGREE: Multiple dietary modification options and treatment options were discussed and  Xavian agreed to follow the recommendations documented in the above note.  ARRANGE: Embert was educated on the importance of frequent visits to treat obesity as outlined per CMS and USPSTF guidelines and agreed to schedule his next follow up appointment today.  Cristi Loron, am acting as Energy manager for [employer]. Manson Passey, DO  I have reviewed the above documentation for accuracy and completeness, and I agree with the above. -Corinna Capra, DO

## 2019-02-07 DIAGNOSIS — E559 Vitamin D deficiency, unspecified: Secondary | ICD-10-CM | POA: Insufficient documentation

## 2019-02-07 DIAGNOSIS — Z9884 Bariatric surgery status: Secondary | ICD-10-CM | POA: Insufficient documentation

## 2019-02-07 DIAGNOSIS — I10 Essential (primary) hypertension: Secondary | ICD-10-CM | POA: Insufficient documentation

## 2019-02-25 ENCOUNTER — Encounter (INDEPENDENT_AMBULATORY_CARE_PROVIDER_SITE_OTHER): Payer: Self-pay

## 2019-02-27 ENCOUNTER — Encounter (INDEPENDENT_AMBULATORY_CARE_PROVIDER_SITE_OTHER): Payer: Self-pay

## 2019-02-27 ENCOUNTER — Ambulatory Visit (INDEPENDENT_AMBULATORY_CARE_PROVIDER_SITE_OTHER): Payer: Commercial Managed Care - PPO | Admitting: Physician Assistant

## 2019-03-05 ENCOUNTER — Other Ambulatory Visit (INDEPENDENT_AMBULATORY_CARE_PROVIDER_SITE_OTHER): Payer: Self-pay | Admitting: Bariatrics

## 2019-03-05 DIAGNOSIS — E559 Vitamin D deficiency, unspecified: Secondary | ICD-10-CM

## 2019-03-11 ENCOUNTER — Encounter (INDEPENDENT_AMBULATORY_CARE_PROVIDER_SITE_OTHER): Payer: Self-pay

## 2019-04-09 DIAGNOSIS — M5136 Other intervertebral disc degeneration, lumbar region: Secondary | ICD-10-CM | POA: Diagnosis not present

## 2019-04-09 DIAGNOSIS — Z79891 Long term (current) use of opiate analgesic: Secondary | ICD-10-CM | POA: Diagnosis not present

## 2019-04-09 DIAGNOSIS — G894 Chronic pain syndrome: Secondary | ICD-10-CM | POA: Diagnosis not present

## 2021-02-26 ENCOUNTER — Other Ambulatory Visit: Payer: Self-pay | Admitting: Nephrology

## 2021-02-26 DIAGNOSIS — N1832 Chronic kidney disease, stage 3b: Secondary | ICD-10-CM

## 2021-03-17 ENCOUNTER — Ambulatory Visit
Admission: RE | Admit: 2021-03-17 | Discharge: 2021-03-17 | Disposition: A | Discharge status: Home / Self Care | Payer: Self-pay | Source: Ambulatory Visit | Attending: Nephrology | Admitting: Nephrology

## 2021-03-17 DIAGNOSIS — N1832 Chronic kidney disease, stage 3b: Secondary | ICD-10-CM

## 2022-02-07 DIAGNOSIS — G894 Chronic pain syndrome: Secondary | ICD-10-CM | POA: Diagnosis not present

## 2022-02-07 DIAGNOSIS — Z79891 Long term (current) use of opiate analgesic: Secondary | ICD-10-CM | POA: Diagnosis not present

## 2022-02-07 DIAGNOSIS — Z6841 Body Mass Index (BMI) 40.0 and over, adult: Secondary | ICD-10-CM | POA: Diagnosis not present

## 2022-02-07 DIAGNOSIS — M5136 Other intervertebral disc degeneration, lumbar region: Secondary | ICD-10-CM | POA: Diagnosis not present

## 2022-02-07 DIAGNOSIS — M5416 Radiculopathy, lumbar region: Secondary | ICD-10-CM | POA: Diagnosis not present

## 2022-04-23 DIAGNOSIS — Z79891 Long term (current) use of opiate analgesic: Secondary | ICD-10-CM | POA: Diagnosis not present

## 2022-04-23 DIAGNOSIS — M5136 Other intervertebral disc degeneration, lumbar region: Secondary | ICD-10-CM | POA: Diagnosis not present

## 2022-04-23 DIAGNOSIS — G894 Chronic pain syndrome: Secondary | ICD-10-CM | POA: Diagnosis not present

## 2022-05-23 DIAGNOSIS — E119 Type 2 diabetes mellitus without complications: Secondary | ICD-10-CM | POA: Diagnosis not present

## 2022-07-15 DIAGNOSIS — M109 Gout, unspecified: Secondary | ICD-10-CM | POA: Diagnosis not present

## 2022-07-15 DIAGNOSIS — M5136 Other intervertebral disc degeneration, lumbar region: Secondary | ICD-10-CM | POA: Diagnosis not present

## 2022-07-15 DIAGNOSIS — I10 Essential (primary) hypertension: Secondary | ICD-10-CM | POA: Diagnosis not present

## 2022-07-15 DIAGNOSIS — N183 Chronic kidney disease, stage 3 unspecified: Secondary | ICD-10-CM | POA: Diagnosis not present

## 2022-07-15 DIAGNOSIS — E785 Hyperlipidemia, unspecified: Secondary | ICD-10-CM | POA: Diagnosis not present

## 2022-07-15 DIAGNOSIS — Z8739 Personal history of other diseases of the musculoskeletal system and connective tissue: Secondary | ICD-10-CM | POA: Diagnosis not present

## 2022-07-15 DIAGNOSIS — R638 Other symptoms and signs concerning food and fluid intake: Secondary | ICD-10-CM | POA: Diagnosis not present

## 2022-07-15 DIAGNOSIS — G894 Chronic pain syndrome: Secondary | ICD-10-CM | POA: Diagnosis not present

## 2022-07-15 DIAGNOSIS — E1165 Type 2 diabetes mellitus with hyperglycemia: Secondary | ICD-10-CM | POA: Diagnosis not present

## 2022-08-14 DIAGNOSIS — M549 Dorsalgia, unspecified: Secondary | ICD-10-CM | POA: Diagnosis not present

## 2022-08-14 DIAGNOSIS — N1831 Chronic kidney disease, stage 3a: Secondary | ICD-10-CM | POA: Diagnosis not present

## 2022-08-14 DIAGNOSIS — Z6841 Body Mass Index (BMI) 40.0 and over, adult: Secondary | ICD-10-CM | POA: Diagnosis not present

## 2022-08-14 DIAGNOSIS — G8929 Other chronic pain: Secondary | ICD-10-CM | POA: Diagnosis not present

## 2022-08-14 DIAGNOSIS — Z0130 Encounter for examination of blood pressure without abnormal findings: Secondary | ICD-10-CM | POA: Diagnosis not present

## 2022-08-14 DIAGNOSIS — I10 Essential (primary) hypertension: Secondary | ICD-10-CM | POA: Diagnosis not present

## 2022-08-14 DIAGNOSIS — Z79899 Other long term (current) drug therapy: Secondary | ICD-10-CM | POA: Diagnosis not present

## 2022-08-14 DIAGNOSIS — M542 Cervicalgia: Secondary | ICD-10-CM | POA: Diagnosis not present

## 2022-08-14 DIAGNOSIS — M47819 Spondylosis without myelopathy or radiculopathy, site unspecified: Secondary | ICD-10-CM | POA: Diagnosis not present

## 2022-08-14 DIAGNOSIS — M109 Gout, unspecified: Secondary | ICD-10-CM | POA: Diagnosis not present

## 2022-09-03 DIAGNOSIS — I10 Essential (primary) hypertension: Secondary | ICD-10-CM | POA: Diagnosis not present

## 2022-09-03 DIAGNOSIS — M542 Cervicalgia: Secondary | ICD-10-CM | POA: Diagnosis not present

## 2022-09-03 DIAGNOSIS — Z6841 Body Mass Index (BMI) 40.0 and over, adult: Secondary | ICD-10-CM | POA: Diagnosis not present

## 2022-09-03 DIAGNOSIS — F1120 Opioid dependence, uncomplicated: Secondary | ICD-10-CM | POA: Diagnosis not present

## 2022-09-03 DIAGNOSIS — Z0130 Encounter for examination of blood pressure without abnormal findings: Secondary | ICD-10-CM | POA: Diagnosis not present

## 2022-09-03 DIAGNOSIS — M109 Gout, unspecified: Secondary | ICD-10-CM | POA: Diagnosis not present

## 2022-09-03 DIAGNOSIS — R030 Elevated blood-pressure reading, without diagnosis of hypertension: Secondary | ICD-10-CM | POA: Diagnosis not present

## 2022-09-03 DIAGNOSIS — Z79899 Other long term (current) drug therapy: Secondary | ICD-10-CM | POA: Diagnosis not present

## 2022-09-03 DIAGNOSIS — N1831 Chronic kidney disease, stage 3a: Secondary | ICD-10-CM | POA: Diagnosis not present

## 2022-10-04 DIAGNOSIS — Z6841 Body Mass Index (BMI) 40.0 and over, adult: Secondary | ICD-10-CM | POA: Diagnosis not present

## 2022-10-04 DIAGNOSIS — M549 Dorsalgia, unspecified: Secondary | ICD-10-CM | POA: Diagnosis not present

## 2022-10-04 DIAGNOSIS — I10 Essential (primary) hypertension: Secondary | ICD-10-CM | POA: Diagnosis not present

## 2022-10-04 DIAGNOSIS — M109 Gout, unspecified: Secondary | ICD-10-CM | POA: Diagnosis not present

## 2022-10-04 DIAGNOSIS — G8929 Other chronic pain: Secondary | ICD-10-CM | POA: Diagnosis not present

## 2022-10-04 DIAGNOSIS — Z0130 Encounter for examination of blood pressure without abnormal findings: Secondary | ICD-10-CM | POA: Diagnosis not present

## 2022-10-04 DIAGNOSIS — F1120 Opioid dependence, uncomplicated: Secondary | ICD-10-CM | POA: Diagnosis not present

## 2022-10-04 DIAGNOSIS — M47819 Spondylosis without myelopathy or radiculopathy, site unspecified: Secondary | ICD-10-CM | POA: Diagnosis not present

## 2022-11-08 DIAGNOSIS — N1831 Chronic kidney disease, stage 3a: Secondary | ICD-10-CM | POA: Diagnosis not present

## 2022-11-08 DIAGNOSIS — R030 Elevated blood-pressure reading, without diagnosis of hypertension: Secondary | ICD-10-CM | POA: Diagnosis not present

## 2022-11-08 DIAGNOSIS — I10 Essential (primary) hypertension: Secondary | ICD-10-CM | POA: Diagnosis not present

## 2022-11-08 DIAGNOSIS — M549 Dorsalgia, unspecified: Secondary | ICD-10-CM | POA: Diagnosis not present

## 2022-11-08 DIAGNOSIS — M109 Gout, unspecified: Secondary | ICD-10-CM | POA: Diagnosis not present

## 2022-11-08 DIAGNOSIS — Z0130 Encounter for examination of blood pressure without abnormal findings: Secondary | ICD-10-CM | POA: Diagnosis not present

## 2022-11-08 DIAGNOSIS — F1120 Opioid dependence, uncomplicated: Secondary | ICD-10-CM | POA: Diagnosis not present

## 2022-11-08 DIAGNOSIS — Z79899 Other long term (current) drug therapy: Secondary | ICD-10-CM | POA: Diagnosis not present

## 2022-11-08 DIAGNOSIS — Z6841 Body Mass Index (BMI) 40.0 and over, adult: Secondary | ICD-10-CM | POA: Diagnosis not present

## 2022-11-18 DIAGNOSIS — E119 Type 2 diabetes mellitus without complications: Secondary | ICD-10-CM | POA: Diagnosis not present

## 2022-11-18 DIAGNOSIS — N189 Chronic kidney disease, unspecified: Secondary | ICD-10-CM | POA: Diagnosis not present

## 2022-11-18 DIAGNOSIS — I10 Essential (primary) hypertension: Secondary | ICD-10-CM | POA: Diagnosis not present

## 2022-11-18 DIAGNOSIS — M109 Gout, unspecified: Secondary | ICD-10-CM | POA: Diagnosis not present

## 2022-11-29 DIAGNOSIS — Z6841 Body Mass Index (BMI) 40.0 and over, adult: Secondary | ICD-10-CM | POA: Diagnosis not present

## 2022-11-29 DIAGNOSIS — Z79899 Other long term (current) drug therapy: Secondary | ICD-10-CM | POA: Diagnosis not present

## 2022-11-29 DIAGNOSIS — R030 Elevated blood-pressure reading, without diagnosis of hypertension: Secondary | ICD-10-CM | POA: Diagnosis not present

## 2022-11-29 DIAGNOSIS — Z0130 Encounter for examination of blood pressure without abnormal findings: Secondary | ICD-10-CM | POA: Diagnosis not present

## 2022-11-29 DIAGNOSIS — M549 Dorsalgia, unspecified: Secondary | ICD-10-CM | POA: Diagnosis not present

## 2022-11-29 DIAGNOSIS — M47819 Spondylosis without myelopathy or radiculopathy, site unspecified: Secondary | ICD-10-CM | POA: Diagnosis not present

## 2022-11-29 DIAGNOSIS — M109 Gout, unspecified: Secondary | ICD-10-CM | POA: Diagnosis not present

## 2022-11-29 DIAGNOSIS — G8929 Other chronic pain: Secondary | ICD-10-CM | POA: Diagnosis not present

## 2022-11-29 DIAGNOSIS — F1120 Opioid dependence, uncomplicated: Secondary | ICD-10-CM | POA: Diagnosis not present

## 2022-11-29 DIAGNOSIS — I10 Essential (primary) hypertension: Secondary | ICD-10-CM | POA: Diagnosis not present

## 2022-12-08 DIAGNOSIS — R771 Abnormality of globulin: Secondary | ICD-10-CM | POA: Diagnosis not present

## 2022-12-08 DIAGNOSIS — E119 Type 2 diabetes mellitus without complications: Secondary | ICD-10-CM | POA: Diagnosis not present

## 2022-12-08 DIAGNOSIS — M109 Gout, unspecified: Secondary | ICD-10-CM | POA: Diagnosis not present

## 2022-12-23 IMAGING — US US RENAL
1 series · 14 of 25 positions shown · non-contrast
Comparison: None.

CLINICAL DATA: Chronic kidney disease

EXAM:
RENAL / URINARY TRACT ULTRASOUND COMPLETE

[Series 1: us renal · 0.28mm/px · 14 of 34 slices shown]
[im 1/34]
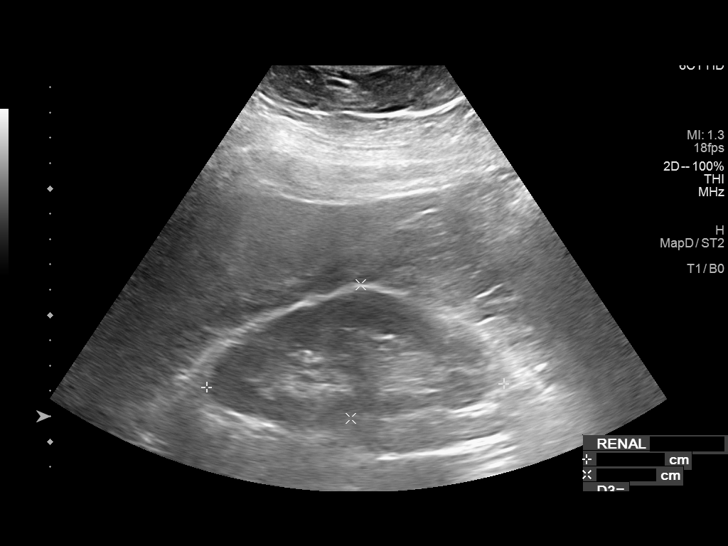
[im 3/34]
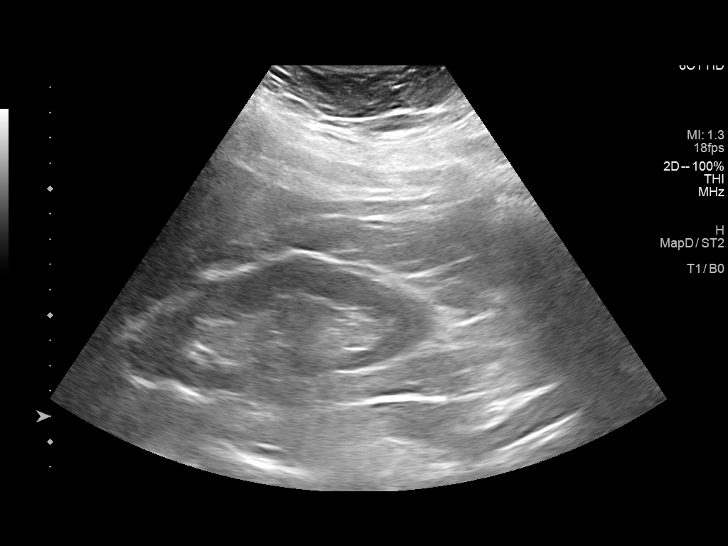
[im 6/34]
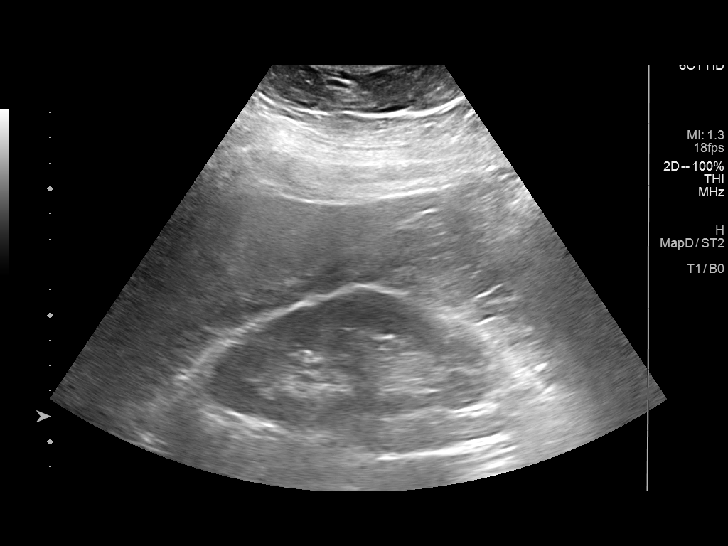
[im 9/34]
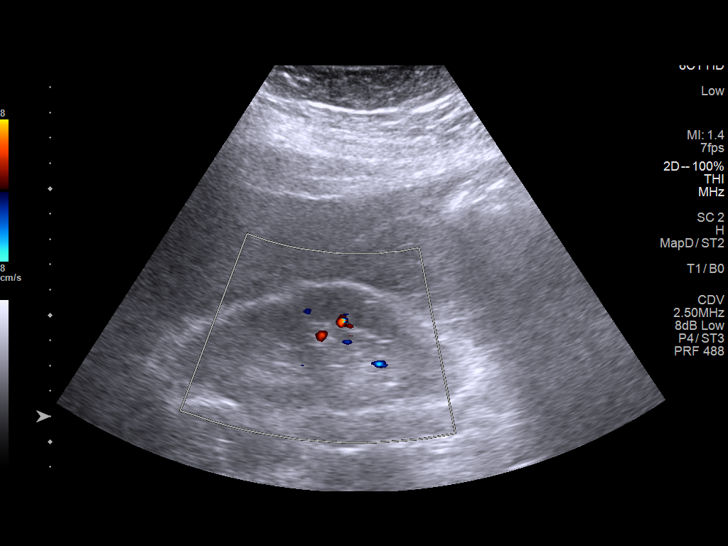
[im 12/34]
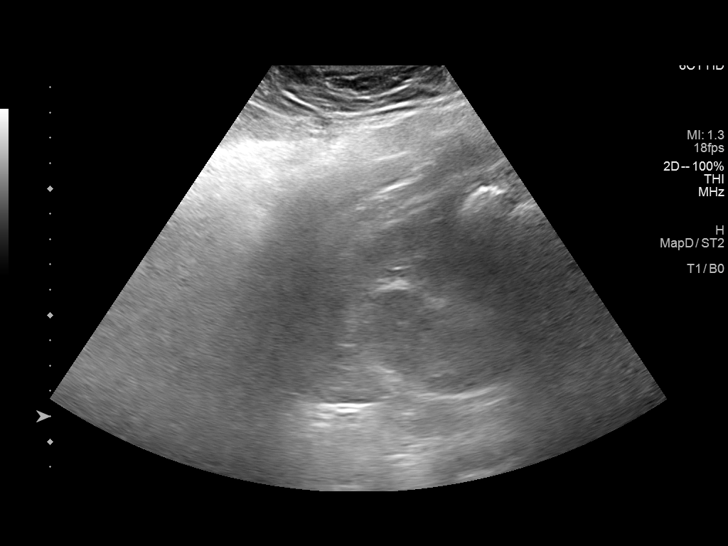
[im 13/34]
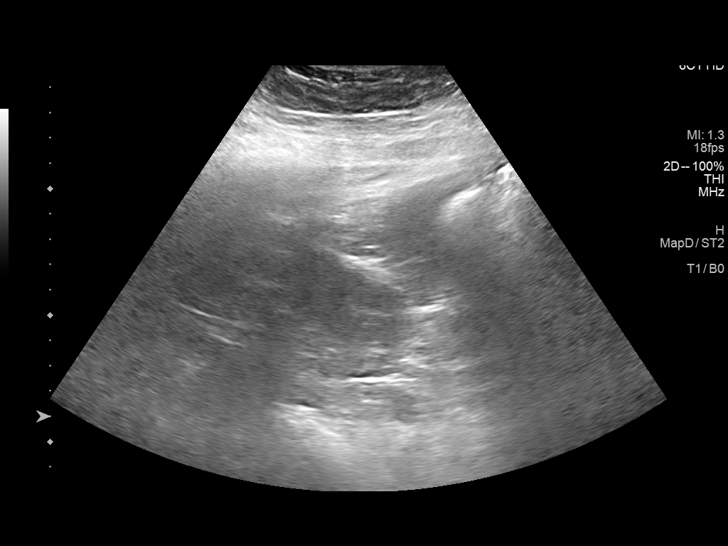
[im 16/34]
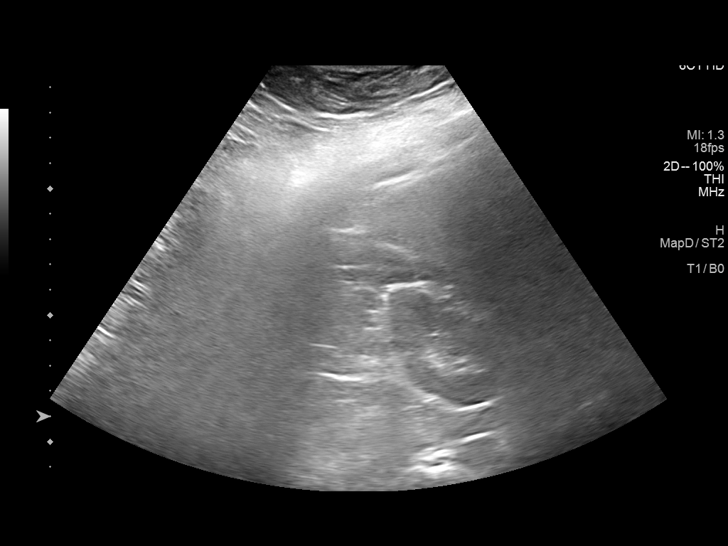
[im 18/34]
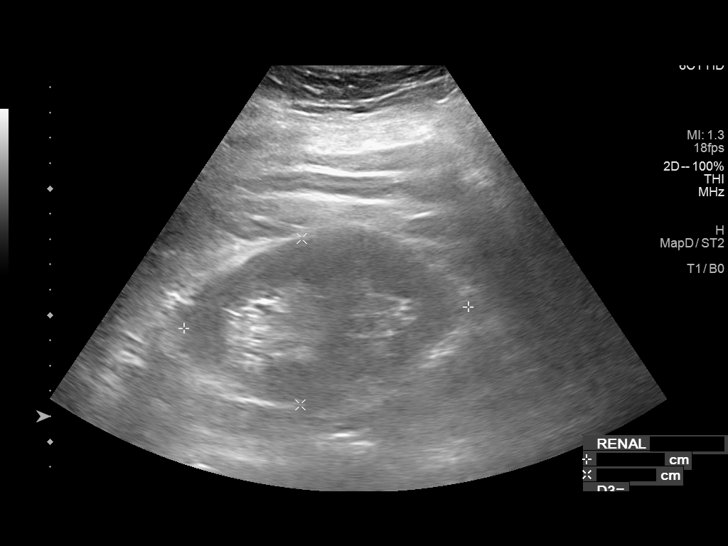
[im 21/34]
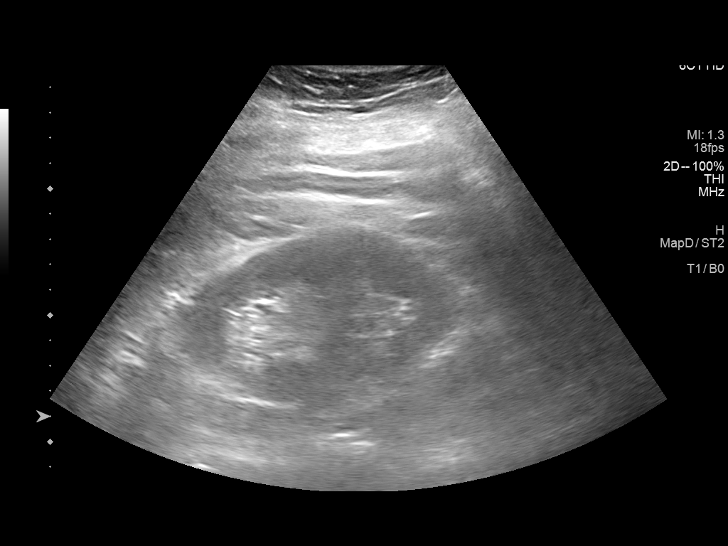
[im 23/34]
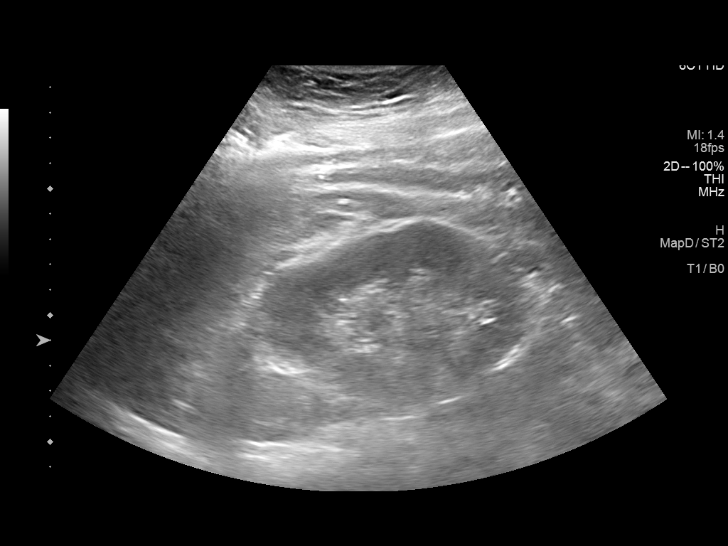
[im 25/34]
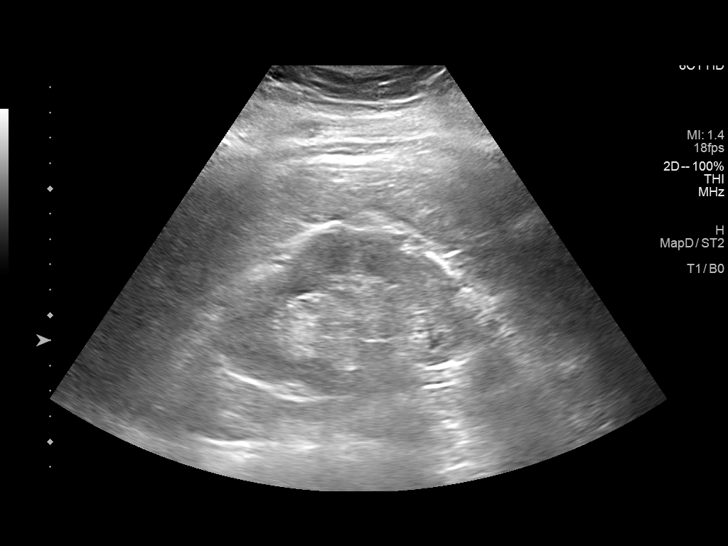
[im 28/34]
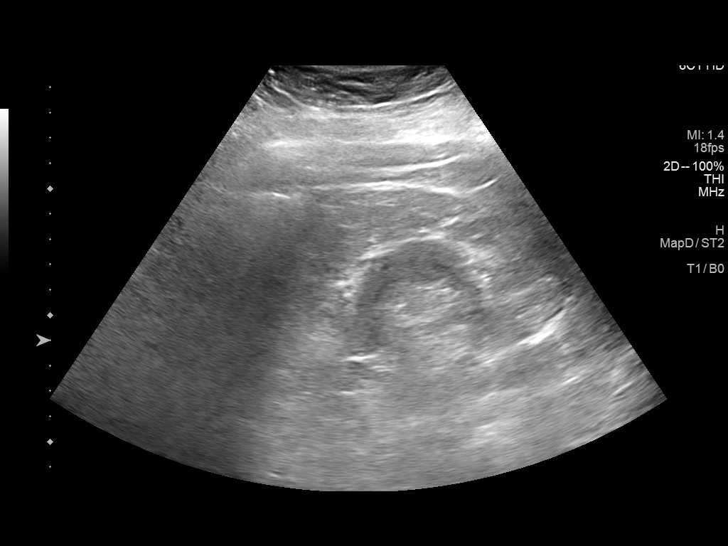
[im 31/34]
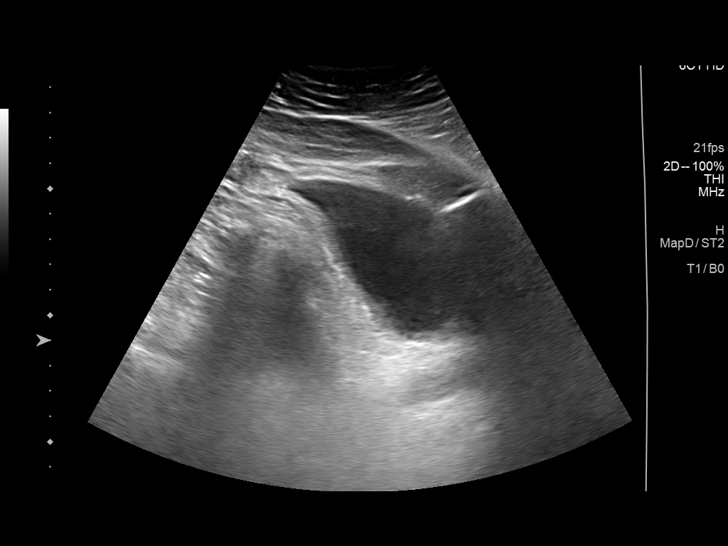
[im 34/34]
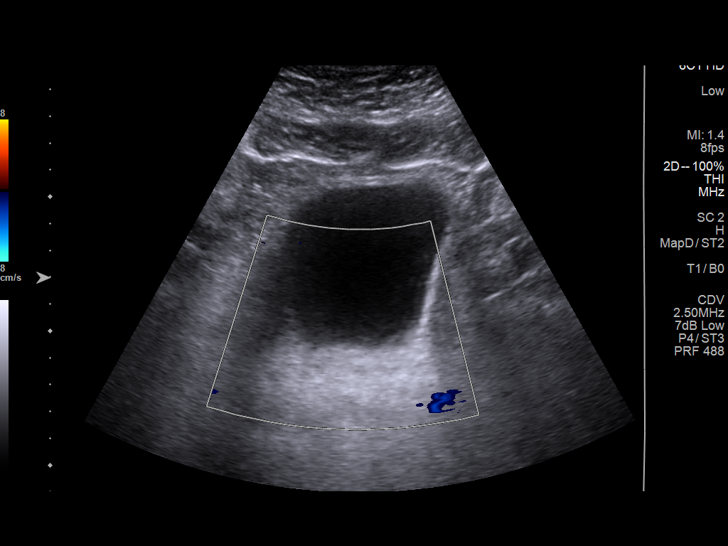

[14 of 25 positions shown; findings below may reference images not displayed]

FINDINGS: Right Kidney:

Renal measurements: 11.7 x 5.3 x 5.2 cm = volume: 169 mL.
Echogenicity within normal limits. No mass or hydronephrosis
visualized.

Left Kidney:

Renal measurements: 11.3 x 6.5 x 6.3 cm = volume: 245 mL.
Echogenicity within normal limits. No mass or hydronephrosis
visualized.

Bladder:

Appears normal for degree of bladder distention.

Other:

None.
IMPRESSION: Normal ultrasound examination of the kidneys.  No hydronephrosis.

## 2023-01-04 DIAGNOSIS — I10 Essential (primary) hypertension: Secondary | ICD-10-CM | POA: Diagnosis not present

## 2023-01-04 DIAGNOSIS — Z79899 Other long term (current) drug therapy: Secondary | ICD-10-CM | POA: Diagnosis not present

## 2023-01-04 DIAGNOSIS — Z6841 Body Mass Index (BMI) 40.0 and over, adult: Secondary | ICD-10-CM | POA: Diagnosis not present

## 2023-01-04 DIAGNOSIS — R600 Localized edema: Secondary | ICD-10-CM | POA: Diagnosis not present

## 2023-01-04 DIAGNOSIS — F1120 Opioid dependence, uncomplicated: Secondary | ICD-10-CM | POA: Diagnosis not present

## 2023-01-04 DIAGNOSIS — M47819 Spondylosis without myelopathy or radiculopathy, site unspecified: Secondary | ICD-10-CM | POA: Diagnosis not present

## 2023-01-04 DIAGNOSIS — M109 Gout, unspecified: Secondary | ICD-10-CM | POA: Diagnosis not present

## 2023-01-04 DIAGNOSIS — Z0130 Encounter for examination of blood pressure without abnormal findings: Secondary | ICD-10-CM | POA: Diagnosis not present

## 2023-02-08 ENCOUNTER — Other Ambulatory Visit (HOSPITAL_COMMUNITY): Payer: Self-pay

## 2023-02-09 ENCOUNTER — Other Ambulatory Visit (HOSPITAL_COMMUNITY): Payer: Self-pay

## 2023-02-09 MED ORDER — WEGOVY 0.25 MG/0.5ML ~~LOC~~ SOAJ
0.2500 mg | SUBCUTANEOUS | 0 refills | Status: DC
  Filled 2023-02-09: qty 2, 28d supply, fill #0

## 2023-02-13 ENCOUNTER — Other Ambulatory Visit (HOSPITAL_COMMUNITY): Payer: Self-pay

## 2023-02-13 MED ORDER — OZEMPIC (0.25 OR 0.5 MG/DOSE) 2 MG/3ML ~~LOC~~ SOPN
0.2500 mg | PEN_INJECTOR | SUBCUTANEOUS | 0 refills | Status: DC
  Filled 2023-02-13: qty 3, 42d supply, fill #0

## 2023-02-13 MED ORDER — ONDANSETRON HCL 4 MG PO TABS
4.0000 mg | ORAL_TABLET | Freq: Four times a day (QID) | ORAL | 0 refills | Status: AC | PRN
  Filled 2023-02-13: qty 20, 5d supply, fill #0

## 2023-02-26 ENCOUNTER — Emergency Department (HOSPITAL_COMMUNITY): Payer: BC Managed Care – PPO

## 2023-02-26 ENCOUNTER — Other Ambulatory Visit: Payer: Self-pay

## 2023-02-26 ENCOUNTER — Encounter (HOSPITAL_COMMUNITY): Payer: Self-pay

## 2023-02-26 ENCOUNTER — Emergency Department (HOSPITAL_COMMUNITY)
Admission: EM | Admit: 2023-02-26 | Discharge: 2023-02-27 | Disposition: A | Discharge status: Home / Self Care | Payer: BC Managed Care – PPO | Attending: Emergency Medicine | Admitting: Emergency Medicine

## 2023-02-26 DIAGNOSIS — R0789 Other chest pain: Secondary | ICD-10-CM | POA: Insufficient documentation

## 2023-02-26 DIAGNOSIS — R079 Chest pain, unspecified: Secondary | ICD-10-CM

## 2023-02-26 DIAGNOSIS — R0602 Shortness of breath: Secondary | ICD-10-CM | POA: Diagnosis not present

## 2023-02-26 DIAGNOSIS — Z79899 Other long term (current) drug therapy: Secondary | ICD-10-CM | POA: Insufficient documentation

## 2023-02-26 DIAGNOSIS — I10 Essential (primary) hypertension: Secondary | ICD-10-CM | POA: Diagnosis not present

## 2023-02-26 DIAGNOSIS — J45909 Unspecified asthma, uncomplicated: Secondary | ICD-10-CM | POA: Diagnosis not present

## 2023-02-26 LAB — BASIC METABOLIC PANEL
Anion gap: 11 (ref 5–15)
BUN: 29 mg/dL — ABNORMAL HIGH (ref 6–20)
CO2: 27 mmol/L (ref 22–32)
Calcium: 9.3 mg/dL (ref 8.9–10.3)
Chloride: 98 mmol/L (ref 98–111)
Creatinine, Ser: 2.04 mg/dL — ABNORMAL HIGH (ref 0.61–1.24)
GFR, Estimated: 37 mL/min — ABNORMAL LOW (ref >60)
Glucose, Bld: 111 mg/dL — ABNORMAL HIGH (ref 70–99)
Potassium: 3.8 mmol/L (ref 3.5–5.1)
Sodium: 136 mmol/L (ref 135–145)

## 2023-02-26 LAB — TROPONIN I (HIGH SENSITIVITY): Troponin I (High Sensitivity): 14 ng/L (ref <18)

## 2023-02-26 LAB — CBC
HCT: 45.8 % (ref 39.0–52.0)
Hemoglobin: 14.6 g/dL (ref 13.0–17.0)
MCH: 28.3 pg (ref 26.0–34.0)
MCHC: 31.9 g/dL (ref 30.0–36.0)
MCV: 88.9 fL (ref 80.0–100.0)
Platelets: 222 10*3/uL (ref 150–400)
RBC: 5.15 MIL/uL (ref 4.22–5.81)
RDW: 15.1 % (ref 11.5–15.5)
WBC: 10.7 10*3/uL — ABNORMAL HIGH (ref 4.0–10.5)
nRBC: 0 % (ref 0.0–0.2)

## 2023-02-26 NOTE — ED Triage Notes (Signed)
Pt arrived to triage complaining of chest pain and shortness of breath that started around 8pm tonight.  Tonight pt states he was walking in his arm and then pain started   Pain is central chest, and pt states breathing and talking hurt worse.  Denies feeling lightheaded or nauseous

## 2023-02-27 DIAGNOSIS — R0789 Other chest pain: Secondary | ICD-10-CM | POA: Diagnosis not present

## 2023-02-27 NOTE — ED Notes (Signed)
This RN entered room to collect repeat troponin, pt refuses and states he wishes to leave, discussed POC, benefits of staying and risks of leaving, pt continues to state he feels better, feels as if he overdid it with physical exertion, and continues to want to leave

## 2023-02-27 NOTE — ED Provider Notes (Signed)
[facility] Provider Note   CSN: [id number] Arrival date & time: 02/26/23  2125     History  Chief Complaint  Patient presents with   Chest Pain    Michael Holland is a 62 y.o. male.  The history is provided by the patient and the spouse.   Patient with history of hypertension, obesity, anxiety, asthma presents with chest pain and shortness of breath.  Patient reports that at approximately 7 PM on March 17 he was cleaning out his car when he started having chest tightness and shortness of breath.  He reports it hurts to breathe and it hurts to talk. No fevers or vomiting.  No cough or hemoptysis.  He has never had the symptoms before.  He had been at his baseline prior to this episode.  He reports the pain is much worse lying flat. Denies any known history of CAD Reports he had a cardiac checkup several years ago that was "normal"  He denies abdominal pain.  No arm or leg weakness Past Medical History:  Diagnosis Date   Anxiety    Asthma    Depression    GERD (gastroesophageal reflux disease)    occ   Gout    H/O gastric bypass    High cholesterol    Hypertension    Joint pain    Lower back pain    MRSA (methicillin resistant Staphylococcus aureus) colonization    nasal swab-had skin leasion years ago in [place] of breath    Sleep apnea    hx pre-gastric bp-lost 100lb   Swelling of both lower extremities     Home Medications Prior to Admission medications   Medication Sig Start Date End Date Taking? Authorizing Provider  amLODipine (NORVASC) 10 MG tablet Take 10 mg by mouth daily. 03/02/17   [provider]  atorvastatin (LIPITOR) 20 MG tablet Take 20 mg by mouth daily.    [provider]  bisacodyl (DULCOLAX) 5 MG EC tablet Take 5-10 mg by mouth daily as needed for moderate constipation. DEPENDS ON CONSTIPATION IF TAKES 1 -2 TABLETS    [provider]  Celery Seed OIL by Does not apply  route.    [provider]  docusate sodium (COLACE) 100 MG capsule Take 1 capsule (100 mg total) by mouth 2 (two) times daily as needed for mild constipation. 03/29/17   Susa Day, MD  fenofibrate 160 MG tablet Take 160 mg by mouth daily.    [provider]  ibuprofen (ADVIL,MOTRIN) 200 MG tablet Take 2 tablets (400 mg total) by mouth every 8 (eight) hours as needed for headache (for pain.). May resume 5 days post-op as needed. 03/30/17   Cecilie Kicks, PA-C  methocarbamol (ROBAXIN) 500 MG tablet Take 1 tablet (500 mg total) by mouth every 6 (six) hours as needed for muscle spasms. 03/29/17   Susa Day, MD  metoprolol (TOPROL-XL) 200 MG 24 hr tablet Take 200 mg by mouth daily. 03/03/17   [provider]  ondansetron (ZOFRAN) 4 MG tablet Take 1 tablet (4 mg total) by mouth 3-4 times daily as needed for nausea. 02/12/23     oxyCODONE-acetaminophen (PERCOCET) 10-325 MG tablet Take 1-2 tablets by mouth every 4 (four) hours as needed for pain (severe). 03/29/17   Susa Day, MD  polyethylene glycol (MIRALAX / GLYCOLAX) packet Take 17 g by mouth daily. 03/29/17   Susa Day, MD  Semaglutide,0.25 or 0.5MG /DOS, (OZEMPIC, 0.25 OR  0.5 MG/DOSE,) 2 MG/3ML SOPN Inject 0.25 mg into the skin once a week for 4 weeks; then increase to 0.5 mg every week 02/12/23     Semaglutide-Weight Management (WEGOVY) 0.25 MG/0.5ML SOAJ Inject 0.25 mg into the skin once a week. 02/08/23     valsartan-hydrochlorothiazide (DIOVAN-HCT) 320-25 MG tablet Take 1 tablet by mouth daily. 03/02/17   [provider]  Vitamin D, Ergocalciferol, (DRISDOL) 1.25 MG (50000 UT) CAPS capsule Take 1 capsule (50,000 Units total) by mouth every 7 (seven) days. 02/06/19   Georgia Lopes, DO      Allergies    Patient has no known allergies.    Review of Systems   Review of Systems  Constitutional:  Negative for fever.  Respiratory:  Positive for shortness of breath.   Cardiovascular:  Positive for chest  pain.    Physical Exam Updated Vital Signs BP (!) 158/68   Pulse 77   Temp 99.2 F (37.3 C)   Resp 19   Ht 1.803 m (5\' 11" )   Wt (!) 172.4 kg   SpO2 96%   BMI 53.00 kg/m  Physical Exam CONSTITUTIONAL: Well developed/well nourished, anxious HEAD: Normocephalic/atraumatic EYES: EOMI/PERRL ENMT: Mucous membranes moist NECK: supple no meningeal signs CV: S1/S2 noted, no murmurs/rubs/gallops noted LUNGS: Lungs are clear to auscultation bilaterally, no apparent distress, tachypneic ABDOMEN: soft, nontender NEURO: Pt is awake/alert/appropriate, moves all extremitiesx4.  No facial droop.   EXTREMITIES: pulses normal/equalx4, full ROM Chronic symmetric edema of the bilateral lower extremities SKIN: warm, color normal PSYCH: Anxious  ED Results / Procedures / Treatments   Labs (all labs ordered are listed, but only abnormal results are displayed) Labs Reviewed  BASIC METABOLIC PANEL - Abnormal; Notable for the following components:      Result Value   Glucose, Bld 111 (*)    BUN 29 (*)    Creatinine, Ser 2.04 (*)    GFR, Estimated 37 (*)    All other components within normal limits  CBC - Abnormal; Notable for the following components:   WBC 10.7 (*)    All other components within normal limits  TROPONIN I (HIGH SENSITIVITY)  TROPONIN I (HIGH SENSITIVITY)    EKG EKG Interpretation  Date/Time:  Monday February 27 2023 00:14:03 EDT Ventricular Rate:  79 PR Interval:  242 QRS Duration: 104 QT Interval:  386 QTC Calculation: 443 R Axis:   5 Text Interpretation: Sinus rhythm Prolonged PR interval Interpretation limited secondary to artifact Confirmed by Ripley Fraise [phone]) on 02/27/2023 12:15:50 AM  Radiology DG Chest 2 View  Result Date: 02/26/2023 CLINICAL DATA:  Chest pain, shortness of breath EXAM: CHEST - 2 VIEW COMPARISON:  None Available. FINDINGS: Heart is borderline in size. Increased markings in the lung bases, likely atelectasis. No overt edema. No  effusions or acute bony abnormality. IMPRESSION: Borderline heart size. Bibasilar atelectasis. Electronically Signed   By: Rolm Baptise M.D.   On: 02/26/2023 22:16    Procedures Procedures    Medications Ordered in ED Medications - No data to display  ED Course/ Medical Decision Making/ A&P Clinical Course as of 02/27/23 0118  Mon Feb 27, 2023  0110 I had a long conversation with the patient about the nature of his complaints.  Patient has a history of hypertension and obesity.  With his onset of exertional chest pain and shortness of breath I was concerned about acute coronary syndrome, but also differential included pericarditis, PE and aortic dissection.  I had a lengthy discussion with  patient about the need for further workup.  Patient initially was hesitant about any further workup.  Also informed him that he would likely need to be admitted given his comorbidities.  Patient was still hesitant to pursue any further workup.  We agreed that I would give the patient several minutes to discuss with his wife before making any further plans.  I was then informed several minutes later that the patient eloped.  Before he left, I did inform him that if he left he would have to except the risk of death, disability or other consequences that cannot be predicted [DW]    Clinical Course User Index [DW] Ripley Fraise, MD                             Medical Decision Making Amount and/or Complexity of Data Reviewed Labs: ordered. Radiology: ordered. ECG/medicine tests: ordered.   This patient presents to the ED for concern of chest pain, this involves an extensive number of treatment options, and is a complaint that carries with it a high risk of complications and morbidity.  The differential diagnosis includes but is not limited to acute coronary syndrome, aortic dissection, pulmonary embolism, pericarditis, pneumothorax, pneumonia, myocarditis, pleurisy, esophageal rupture   Comorbidities that  complicate the patient evaluation: Patient's presentation is complicated by their history of hypertension and obesity   Additional history obtained: Records reviewed Primary Care Documents  Lab Tests: I Ordered, and personally interpreted labs.  The pertinent results include: Renal insufficiency  Imaging Studies ordered: I ordered imaging studies including X-ray chest   I independently visualized and interpreted imaging which showed cardiomegaly I agree with the radiologist interpretation   Complexity of problems addressed: Patient's presentation is most consistent with  acute presentation with potential threat to life or bodily function  Disposition: After consideration of the diagnostic results and the patient's response to treatment,  I feel that the patent would benefit from admission but he left AGAINST MEDICAL ADVICE .           Final Clinical Impression(s) / ED Diagnoses Final diagnoses:  Exertional chest pain    Rx / DC Orders ED Discharge Orders     None         Ripley Fraise, MD 02/27/23 [phone]

## 2023-02-28 ENCOUNTER — Other Ambulatory Visit (HOSPITAL_COMMUNITY): Payer: Self-pay

## 2023-07-06 ENCOUNTER — Other Ambulatory Visit: Payer: Self-pay

## 2023-07-06 ENCOUNTER — Encounter (HOSPITAL_COMMUNITY): Payer: Self-pay

## 2023-07-06 ENCOUNTER — Emergency Department (HOSPITAL_COMMUNITY)
Admission: EM | Admit: 2023-07-06 | Discharge: 2023-07-07 | Disposition: A | Discharge status: Home / Self Care | Payer: BC Managed Care – PPO | Attending: Emergency Medicine | Admitting: Emergency Medicine

## 2023-07-06 DIAGNOSIS — N189 Chronic kidney disease, unspecified: Secondary | ICD-10-CM | POA: Insufficient documentation

## 2023-07-06 DIAGNOSIS — M545 Low back pain, unspecified: Secondary | ICD-10-CM | POA: Insufficient documentation

## 2023-07-06 DIAGNOSIS — M549 Dorsalgia, unspecified: Secondary | ICD-10-CM | POA: Diagnosis present

## 2023-07-06 DIAGNOSIS — Z794 Long term (current) use of insulin: Secondary | ICD-10-CM | POA: Diagnosis not present

## 2023-07-06 DIAGNOSIS — R109 Unspecified abdominal pain: Secondary | ICD-10-CM | POA: Diagnosis not present

## 2023-07-06 DIAGNOSIS — Z79899 Other long term (current) drug therapy: Secondary | ICD-10-CM | POA: Insufficient documentation

## 2023-07-06 DIAGNOSIS — I129 Hypertensive chronic kidney disease with stage 1 through stage 4 chronic kidney disease, or unspecified chronic kidney disease: Secondary | ICD-10-CM | POA: Diagnosis not present

## 2023-07-06 DIAGNOSIS — R0602 Shortness of breath: Secondary | ICD-10-CM | POA: Diagnosis not present

## 2023-07-06 LAB — URINALYSIS, ROUTINE W REFLEX MICROSCOPIC
Bilirubin Urine: NEGATIVE
Glucose, UA: NEGATIVE mg/dL
Hgb urine dipstick: NEGATIVE
Ketones, ur: NEGATIVE mg/dL
Leukocytes,Ua: NEGATIVE
Nitrite: NEGATIVE
Protein, ur: NEGATIVE mg/dL
Specific Gravity, Urine: 1.016 (ref 1.005–1.030)
pH: 5 (ref 5.0–8.0)

## 2023-07-06 LAB — CBC
HCT: 42.2 % (ref 39.0–52.0)
Hemoglobin: 13.4 g/dL (ref 13.0–17.0)
MCH: 28.4 pg (ref 26.0–34.0)
MCHC: 31.8 g/dL (ref 30.0–36.0)
MCV: 89.4 fL (ref 80.0–100.0)
Platelets: 284 10*3/uL (ref 150–400)
RBC: 4.72 MIL/uL (ref 4.22–5.81)
RDW: 14.9 % (ref 11.5–15.5)
WBC: 10 10*3/uL (ref 4.0–10.5)
nRBC: 0 % (ref 0.0–0.2)

## 2023-07-06 LAB — COMPREHENSIVE METABOLIC PANEL
ALT: 26 U/L (ref 0–44)
AST: 32 U/L (ref 15–41)
Albumin: 2.9 g/dL — ABNORMAL LOW (ref 3.5–5.0)
Alkaline Phosphatase: 47 U/L (ref 38–126)
Anion gap: 13 (ref 5–15)
BUN: 39 mg/dL — ABNORMAL HIGH (ref 6–20)
CO2: 28 mmol/L (ref 22–32)
Calcium: 9.6 mg/dL (ref 8.9–10.3)
Chloride: 98 mmol/L (ref 98–111)
Creatinine, Ser: 2.44 mg/dL — ABNORMAL HIGH (ref 0.61–1.24)
GFR, Estimated: 30 mL/min — ABNORMAL LOW (ref >60)
Glucose, Bld: 102 mg/dL — ABNORMAL HIGH (ref 70–99)
Potassium: 4.3 mmol/L (ref 3.5–5.1)
Sodium: 139 mmol/L (ref 135–145)
Total Bilirubin: 0.3 mg/dL (ref 0.3–1.2)
Total Protein: 8.3 g/dL — ABNORMAL HIGH (ref 6.5–8.1)

## 2023-07-06 NOTE — ED Triage Notes (Signed)
Lower back pain that's described as squeezing all around the waist x 4-5 days.  Also sts lower legs aren't working properly as unable to bend them appropriately and cramping.   Says he has been having dark urine.   Works as a [employer]or, [employer] but has been staying very hydrated while at work

## 2023-07-07 ENCOUNTER — Emergency Department (HOSPITAL_COMMUNITY): Payer: BC Managed Care – PPO

## 2023-07-07 DIAGNOSIS — M545 Low back pain, unspecified: Secondary | ICD-10-CM | POA: Diagnosis not present

## 2023-07-07 LAB — SEDIMENTATION RATE: Sed Rate: 78 mm/hr — ABNORMAL HIGH (ref 0–16)

## 2023-07-07 LAB — C-REACTIVE PROTEIN: CRP: 16.6 mg/dL — ABNORMAL HIGH (ref <1.0)

## 2023-07-07 LAB — CK: Total CK: 249 U/L (ref 49–397)

## 2023-07-07 MED ORDER — DIPHENHYDRAMINE HCL 50 MG/ML IJ SOLN
12.5000 mg | Freq: Once | INTRAMUSCULAR | Status: AC
  Administered 2023-07-07: 12.5 mg via INTRAVENOUS
  Filled 2023-07-07: qty 1

## 2023-07-07 MED ORDER — SODIUM CHLORIDE 0.9 % IV BOLUS
500.0000 mL | Freq: Once | INTRAVENOUS | Status: AC
  Administered 2023-07-07: 500 mL via INTRAVENOUS

## 2023-07-07 MED ORDER — HYDROMORPHONE HCL 1 MG/ML IJ SOLN
1.0000 mg | Freq: Once | INTRAMUSCULAR | Status: AC
  Administered 2023-07-07: 1 mg via INTRAVENOUS
  Filled 2023-07-07: qty 1

## 2023-07-07 MED ORDER — HYDROXYZINE HCL 25 MG PO TABS
25.0000 mg | ORAL_TABLET | Freq: Once | ORAL | Status: AC
  Administered 2023-07-07: 25 mg via ORAL
  Filled 2023-07-07: qty 1

## 2023-07-07 MED ORDER — OXYCODONE-ACETAMINOPHEN 5-325 MG PO TABS
2.0000 | ORAL_TABLET | Freq: Once | ORAL | Status: AC
  Administered 2023-07-07: 2 via ORAL
  Filled 2023-07-07: qty 2

## 2023-07-07 NOTE — ED Notes (Signed)
Pt transported to MRI 

## 2023-07-07 NOTE — Discharge Instructions (Addendum)
You have been seen in the Emergency Department (ED)  today for back pain.  Your workup and exam have not shown any acute abnormalities and you are likely suffering from muscle strain or possible problems with your discs, but there is no treatment that will fix your symptoms at this time.  Please take Motrin (ibuprofen) as needed for your pain according to the instructions written on the box.  Alternatively, for the next five days you can take 600mg three times daily with meals (it may upset your stomach).  Please follow up with your doctor as soon as possible regarding today's ED visit and your back pain.  Return to the ED for worsening back pain, fever, weakness or numbness of either leg, or if you develop either (1) an inability to urinate or have bowel movements, or (2) loss of your ability to control your bathroom functions (if you start having "accidents"), or if you develop other new symptoms that concern you.  

## 2023-07-07 NOTE — ED Notes (Signed)
Patient transported to CT 

## 2023-07-07 NOTE — ED Notes (Signed)
MRI notified that patient is ready.

## 2023-07-07 NOTE — Care Management (Signed)
    Durable Medical Equipment  (From admission, onward)           Start     Ordered   07/07/23 1402  For home use only DME standard manual wheelchair with seat cushion  Once       Comments: Patient suffers from back pain which impairs their ability to perform daily activities like bathing, dressing, grooming, and toileting in the home.  A cane, crutch, or walker will not resolve issue with performing activities of daily living. A wheelchair will allow patient to safely perform daily activities. Patient can safely propel the wheelchair in the home or has a caregiver who can provide assistance. Length of need 6 months . Accessories: elevating leg rests (ELRs), wheel locks, extensions and anti-tippers.   07/07/23 1402

## 2023-07-07 NOTE — ED Notes (Signed)
RN gave PO hydroxyzine per pt request for itching (see MAR). This RN was about to collect labs but had to step out due to critical EMS. RN made patient aware that she will be back once she is done in another room to finish drawing labs. Pt called out requesting RN. Another RN went into room and the pt requested that he never got his medication and that he wanted what was in the saline syringe that was still in its package. RN told pt he received his hydroxyzine around 0915 for his itching and that all that needed to be done was labs. Pt requested to speak to charge RN. This RN notified Charge RN Ladona Ridgel that he would like to speak to her.

## 2023-07-07 NOTE — ED Provider Notes (Signed)
[place] EMERGENCY DEPARTMENT AT [facility] Provider Note   CSN: [id number] Arrival date & time: 07/06/23  1937     History  Chief Complaint  Patient presents with   Back Pain    Michael Holland is a 62 y.o. male.  The history is provided by the patient and medical records.  Back Pain Michael Holland is a 62 y.o. male who presents to the Emergency Department complaining of back pain.  He has a history of chronic back pain due to injury sustained in 2013 and has had numerous orthopedic surgeries.  He states that his back pain worsened Friday or Saturday and then on Sunday he was unable to get out of bed or put weight on his legs and could not raise his feet.  He feels like he is split in half and describes the pain as coming through and around his body.  No fever.  He feels short of breath and feels like the top of his body is smashing his bottom body.  He complains of dark urine and catching cramps.  No dysuria or incontinence.  He also complains of lower extremity edema since Sunday.  He has a history of hypertension, hyperlipidemia.  He takes Percocet 10 once or twice daily as needed for pain.    Home Medications Prior to Admission medications   Medication Sig Start Date End Date Taking? Authorizing Provider  amLODipine (NORVASC) 10 MG tablet Take 10 mg by mouth daily. 03/02/17   [provider]  atorvastatin (LIPITOR) 20 MG tablet Take 20 mg by mouth daily.    [provider]  bisacodyl (DULCOLAX) 5 MG EC tablet Take 5-10 mg by mouth daily as needed for moderate constipation. DEPENDS ON CONSTIPATION IF TAKES 1 -2 TABLETS    [provider]  Celery Seed OIL by Does not apply route.    [provider]  docusate sodium (COLACE) 100 MG capsule Take 1 capsule (100 mg total) by mouth 2 (two) times daily as needed for mild constipation. 03/29/17   Jene Every, MD  fenofibrate 160 MG tablet Take 160 mg by mouth daily.    [provider]  ibuprofen (ADVIL,MOTRIN) 200 MG tablet Take 2 tablets (400 mg total) by mouth every 8 (eight) hours as needed for headache (for pain.). May resume 5 days post-op as needed. 03/30/17   Dorothy Spark, PA-C  methocarbamol (ROBAXIN) 500 MG tablet Take 1 tablet (500 mg total) by mouth every 6 (six) hours as needed for muscle spasms. 03/29/17   Jene Every, MD  metoprolol (TOPROL-XL) 200 MG 24 hr tablet Take 200 mg by mouth daily. 03/03/17   [provider]  ondansetron (ZOFRAN) 4 MG tablet Take 1 tablet (4 mg total) by mouth 3-4 times daily as needed for nausea. 02/12/23     oxyCODONE-acetaminophen (PERCOCET) 10-325 MG tablet Take 1-2 tablets by mouth every 4 (four) hours as needed for pain (severe). 03/29/17   Jene Every, MD  polyethylene glycol (MIRALAX / GLYCOLAX) packet Take 17 g by mouth daily. 03/29/17   Jene Every, MD  Semaglutide,0.25 or 0.5MG /DOS, (OZEMPIC, 0.25 OR 0.5 MG/DOSE,) 2 MG/3ML SOPN Inject 0.25 mg into the skin once a week for 4 weeks; then increase to 0.5 mg every week 02/12/23     Semaglutide-Weight Management (WEGOVY) 0.25 MG/0.5ML SOAJ Inject 0.25 mg into the skin once a week. 02/08/23     valsartan-hydrochlorothiazide (DIOVAN-HCT) 320-25 MG tablet Take 1 tablet by mouth daily. 03/02/17  [provider]  Vitamin D, Ergocalciferol, (DRISDOL) 1.25 MG (50000 UT) CAPS capsule Take 1 capsule (50,000 Units total) by mouth every 7 (seven) days. 02/06/19   Roswell Nickel, DO      Allergies    Patient has no known allergies.    Review of Systems   Review of Systems  Musculoskeletal:  Positive for back pain.  All other systems reviewed and are negative.   Physical Exam Updated Vital Signs BP (!) 143/61 (BP Location: Right Arm)   Pulse 89   Temp 98.5 F (36.9 C) (Oral)   Resp 20   Ht 5\' 11"  (1.803 m)   Wt (!) 163.3 kg   SpO2 93%   BMI 50.21 kg/m  Physical Exam Vitals and nursing note reviewed.  Constitutional:      Appearance: He is  well-developed.  HENT:     Head: Normocephalic and atraumatic.  Cardiovascular:     Rate and Rhythm: Normal rate and regular rhythm.     Heart sounds: No murmur heard. Pulmonary:     Effort: Pulmonary effort is normal. No respiratory distress.     Breath sounds: Normal breath sounds.  Abdominal:     Palpations: Abdomen is soft.     Tenderness: There is no abdominal tenderness. There is no guarding or rebound.  Musculoskeletal:     Comments: There is tenderness to palpation over the upper and mid lumbar spine without any overlying lesions.  There is nonpitting edema to bilateral lower extremities.  2+ DP pulses.  Skin:    General: Skin is warm and dry.  Neurological:     Mental Status: He is alert and oriented to person, place, and time.     Comments: Sensation to light touch is intact in bilateral lower extremities.  He is able to lift bilateral lower extremities off the stretcher but is unable to keep them raised.  He has 4 out of 5 strength in dorsiflexion plantarflexion bilaterally.  Psychiatric:        Behavior: Behavior normal.     ED Results / Procedures / Treatments   Labs (all labs ordered are listed, but only abnormal results are displayed) Labs Reviewed  COMPREHENSIVE METABOLIC PANEL - Abnormal; Notable for the following components:      Result Value   Glucose, Bld 102 (*)    BUN 39 (*)    Creatinine, Ser 2.44 (*)    Total Protein 8.3 (*)    Albumin 2.9 (*)    GFR, Estimated 30 (*)    All other components within normal limits  CBC  URINALYSIS, ROUTINE W REFLEX MICROSCOPIC  CK    EKG None  Radiology CT ABDOMEN PELVIS WO CONTRAST  Result Date: 07/07/2023 CLINICAL DATA:  Abdominal and back pain. EXAM: CT ABDOMEN AND PELVIS WITHOUT CONTRAST TECHNIQUE: Multidetector CT imaging of the abdomen and pelvis was performed following the standard protocol without IV contrast. RADIATION DOSE REDUCTION: This exam was performed according to the departmental dose-optimization  program which includes automated exposure control, adjustment of the mA and/or kV according to patient size and/or use of iterative reconstruction technique. COMPARISON:  Postmyelographic CT images 11/10/14 and 08/02/2016 FINDINGS: Lower chest: No abnormality. Hepatobiliary: The liver is 19.5 cm in length with mild-to-moderate steatosis. No mass is seen without contrast. Gallbladder and bile ducts are unremarkable. Pancreas: No masses seen without contrast. Spleen: Unremarkable without contrast.  No splenomegaly. Adrenals/Urinary Tract: Chronic slight nodular thickening both adrenal glands. No suspicious adrenal abnormality. There is interval increased  bilateral symmetric perinephric stranding, which could be senescent or could be inflammatory in etiology. There is no contour deforming abnormality of either kidney. No evidence of urinary stones, obstruction or hydronephrosis. The bladder appears normal for the degree of distention. Stomach/Bowel: Prior sleeve gastrectomy. Otherwise unremarkable gastric wall. No small bowel obstruction or inflammation is seen. The appendix is normal and well visible. There is mild-to-moderate fecal retention ascending and transverse colon. There is colonic diverticulosis without acute diverticulitis. Vascular/Lymphatic: Aortic atherosclerosis. No enlarged abdominal or pelvic lymph nodes. Reproductive: Enlarged prostate, measures 4.8 cm transverse. Other: There are small umbilical and bilateral inguinal fat hernias. There is no incarcerated hernia or other abdominal wall abnormality. There is no free fluid, free hemorrhage or free air. Musculoskeletal: Age advanced degenerative change of the spine. Moderate hip DJD. A slightly expansile elongate hypodense lesion measuring 2.8 x 2 x 4.3 cm in the posterior column of the right acetabulum is noted, with a well-circumscribed benign-appearing border. 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 At this age, other  etiologies are possible such as lytic phase of Paget's disease and a metastasis. Myelomatous involvement is also a consideration. Further evaluation is recommended. MRI without and with contrast may be helpful. There are degenerative changes causing acquired spinal canal stenosis in the lumbar spine from L2-3 through L4-5. There is acquired foraminal stenosis at the lowest 3 levels. IMPRESSION: 1. Increased bilateral symmetric perinephric stranding, which could be senescent or could be inflammatory in etiology. 2. Constipation and diverticulosis. 3. Umbilical and inguinal fat hernias. 4. Prostatomegaly. 5. Slightly expansile elongate hypodense lesion in the posterior column right acetabulum. 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 At this age, other etiologies are possible such as lytic phase of Paget's disease and a metastasis. Myelomatous involvement is also a consideration. Further evaluation is recommended. MRI without and with contrast may be helpful. 6. Aortic atherosclerosis. 7. Mild-to-moderate hepatic steatosis. Aortic Atherosclerosis (ICD10-I70.0). Electronically Signed   By: Almira Bar M.D.   On: 07/07/2023 02:26   CT L-SPINE NO CHARGE  Result Date: 07/07/2023 CLINICAL DATA:  Initial evaluation for lower back pain. EXAM: CT LUMBAR SPINE WITHOUT CONTRAST TECHNIQUE: Multidetector CT imaging of the lumbar spine was performed without intravenous contrast administration. Multiplanar CT image reconstructions were also generated. RADIATION DOSE REDUCTION: This exam was performed according to the departmental dose-optimization program which includes automated exposure control, adjustment of the mA and/or kV according to patient size and/or use of iterative reconstruction technique. COMPARISON:  Comparison made with prior CT from 08/02/2016. FINDINGS: Segmentation: Standard. Lowest well-formed disc space labeled the L5-S1 level. Alignment: Straightening of the normal lumbar lordosis.  Trace degenerative retrolisthesis of L5 on S1. Vertebrae: Vertebral body height maintained without acute or chronic fracture. Visualized sacrum and pelvis intact. No worrisome osseous lesions. Paraspinal and other soft tissues: Paraspinous soft tissues demonstrate no acute finding. Mild aortic atherosclerosis. Disc levels: L1-2: Degenerative endplate spurring without significant disc bulge. Mild facet hypertrophy. No spinal stenosis. Foramina remain patent. L2-3: Mild disc bulge. Superimposed right extraforaminal disc protrusion with annular calcification. Mild facet hypertrophy. Underlying short pedicles with a degree of mild congenital spinal stenosis. Moderate right worse than left L2 foraminal narrowing. L3-4: Mild disc bulge. Superimposed left foraminal to extraforaminal disc protrusion with annular calcification. Mild endplate spurring. Mild bilateral facet hypertrophy. Underlying short pedicles with mild spinal stenosis. Moderate left worse than right L3 foraminal narrowing. L4-5: Mild disc bulge. Associated reactive endplate change with marginal endplate osteophytic spurring. Moderate  right worse than left facet arthrosis. Remote posterior decompression. Underlying short pedicles. Residual mild to moderate bilateral subarticular stenosis. Moderate bilateral L4 foraminal narrowing. L5-S1: Trace retrolisthesis with degenerative intervertebral disc space narrowing. Diffuse disc bulge with reactive endplate spurring. Moderate right worse than left facet arthrosis. No significant spinal stenosis. Severe bilateral L5 foraminal narrowing. IMPRESSION: 1. No acute osseous abnormality within the lumbar spine. 2. Acquired on congenital mild diffuse spinal stenosis at L2-3 through L4-5. 3. Moderate bilateral L2 through L4 foraminal narrowing, with severe bilateral L5 foraminal stenosis related to disc bulge, facet hypertrophy, and short pedicles. Aortic Atherosclerosis (ICD10-I70.0). Electronically Signed   By: Rise Mu M.D.   On: 07/07/2023 01:53   DG Chest Port 1 View  Result Date: 07/07/2023 CLINICAL DATA:  Shortness of breath EXAM: PORTABLE CHEST 1 VIEW COMPARISON:  02/26/2023 FINDINGS: The heart size and mediastinal contours are within normal limits. Both lungs are clear. The visualized skeletal structures are unremarkable. IMPRESSION: No active disease. Electronically Signed   By: Alcide Clever M.D.   On: 07/07/2023 01:00    Procedures Procedures    Medications Ordered in ED Medications  HYDROmorphone (DILAUDID) injection 1 mg (1 mg Intravenous Given 07/07/23 0116)  sodium chloride 0.9 % bolus 500 mL (0 mLs Intravenous Stopped 07/07/23 0143)  diphenhydrAMINE (BENADRYL) injection 12.5 mg (12.5 mg Intravenous Given 07/07/23 0412)  oxyCODONE-acetaminophen (PERCOCET/ROXICET) 5-325 MG per tablet 2 tablet (2 tablets Oral Given 07/07/23 1610)    ED Course/ Medical Decision Making/ A&P                             Medical Decision Making Amount and/or Complexity of Data Reviewed Labs: ordered. Radiology: ordered.  Risk Prescription drug management.   Patient with history of hypertension, hyperlipidemia, chronic back pain status post multiple surgeries here for evaluation of significant worsening of his back pain.  He also has CKD and his creatinine is slightly increased but close to his baseline.  He was did with pain medications in the emergency department.  He did develop itching about 30 minutes after hydromorphone administration.  He was later given diphenhydramine for this.  He has pain that radiates around to his abdomen and a CT abdomen pelvis was obtained.  CT demonstrates severe spine disease as well as possible lesion in his right pelvis that will require further evaluation.  UA is not consistent with UTI.  He also has he does have weakness on examination but is unclear how much is secondary to pain.  Given his significant symptoms, potential new weakness MRI ordered to further evaluate  for compressive lesion.  Patient care transferred pending MRI.        Final Clinical Impression(s) / ED Diagnoses Final diagnoses:  None    Rx / DC Orders ED Discharge Orders     None         Tilden Fossa, MD 07/07/23 [phone]

## 2023-07-07 NOTE — ED Provider Notes (Addendum)
Blood pressure (!) 140/65, pulse 90, temperature 98.5 F (36.9 C), temperature source Oral, resp. rate 18, height 5\' 11"  (1.803 m), weight (!) 163.3 kg, SpO2 98%.  Assuming care from Dr. Madilyn Hook.  In short, Michael Holland is a 62 y.o. male with a chief complaint of Back Pain .  Refer to the original H&P for additional details.  The current plan of care is to follow up on MRI results.  09:05 AM  MRI reviewed. Will send ESR/CRP. On reassessment, patient has acute on chronic back pain. He has not had fever or chills. Follows with Dr. Shelle Iron with [facility]. Will reach out to that service for MRI review and recommendations.   11:43 AM Per MD on call for [facility] Odis Hollingshead) patient has been discharged from their practice. Advises I speak with NSG. Spoke with Dr. Franky Macho who reviewed the case with me by phone and MRI images. No indication for emergent surgery. Patient can either call his ortho team for follow up or can follow with him as an outpatient. He does not think the fluid on MRI is consistent with infection.   Discussed results with patient. Will d/c home with a wheelchair and home health ordered. Patient's wife can drive him home after we have the wheelchair at bedside.     Maia Plan, MD 07/07/23 1313    Maia Plan, MD 07/07/23 [phone]

## 2023-07-07 NOTE — ED Notes (Signed)
This RN attempted to call MRI and got no response.

## 2023-07-07 NOTE — Progress Notes (Signed)
Pt was able to get through his MRI of the lumbar but said he did not want to continue and have his hip done.

## 2024-04-26 ENCOUNTER — Other Ambulatory Visit: Payer: Self-pay | Admitting: Urology

## 2024-05-01 NOTE — Patient Instructions (Addendum)
 SURGICAL WAITING ROOM VISITATION  Patients having surgery or a procedure may have no more than 2 support people in the waiting area - these visitors may rotate.    Children under the age of 86 must have an adult with them who is not the patient.   Visitors with respiratory illnesses are discouraged from visiting and should remain at home.  If the patient needs to stay at the hospital during part of their recovery, the visitor guidelines for inpatient rooms apply. Pre-op nurse will coordinate an appropriate time for 1 support person to accompany patient in pre-op.  This support person may not rotate.    Please refer to the [facility] website for the visitor guidelines for Inpatients (after your surgery is over and you are in a regular room).       Your procedure is scheduled on: 05-08-24   Report to [facility] Main Entrance    Report to admitting at     0845  AM   Call this number if you have problems the morning of surgery [phone]   Do not eat food OR DRINK LIQUIDS:After Midnight.                  If you have questions, please contact your surgeon's office.   FOLLOW  ANY ADDITIONAL PRE OP INSTRUCTIONS YOU RECEIVED FROM YOUR SURGEON'S OFFICE!!!     Oral Hygiene is also important to reduce your risk of infection.                                    Remember - BRUSH YOUR TEETH THE MORNING OF SURGERY WITH YOUR REGULAR TOOTHPASTE  DENTURES WILL BE REMOVED PRIOR TO SURGERY PLEASE DO NOT APPLY "Poly grip" OR ADHESIVES!!!   Do NOT smoke after Midnight   Stop all vitamins and herbal supplements 7 days before surgery.   Take these medicines the morning of surgery with A SIP OF WATER: metoprolol ,, amlodipine , allopurinol, rosuvastatin, Oxycodone  if needed                     WEGOVY  hold ONE  week prior to surgery  DO NOT TAKE ANY ORAL DIABETIC MEDICATIONS DAY OF YOUR SURGERY  Bring CPAP mask and tubing day of surgery.                              You may not  have any metal on your body including hair pins, jewelry, and body piercing             Do not wear  lotions, powders, /cologne, or deodorant               Men may shave face and neck.   Do not bring valuables to the hospital. [facility] IS NOT             RESPONSIBLE   FOR VALUABLES.   Contacts, glasses, dentures or bridgework may not be worn into surgery.   Bring small overnight bag day of surgery.   DO NOT BRING YOUR HOME MEDICATIONS TO THE HOSPITAL. PHARMACY WILL DISPENSE MEDICATIONS LISTED ON YOUR MEDICATION LIST TO YOU DURING YOUR ADMISSION IN THE HOSPITAL!    Patients discharged on the day of surgery will not be allowed to drive home.  Someone NEEDS to stay with you for the first 24 hours after  anesthesia.   Special Instructions: Bring a copy of your healthcare power of attorney and living will documents the day of surgery if you haven't scanned them before.              Please read over the following fact sheets you were given: IF YOU HAVE QUESTIONS ABOUT YOUR PRE-OP INSTRUCTIONS PLEASE CALL [phone]    If you test positive for Covid or have been in contact with anyone that has tested positive in the last 10 days please notify you surgeon.    [facility] - Preparing for Surgery Before surgery, you can play an important role.  Because skin is not sterile, your skin needs to be as free of germs as possible.  You can reduce the number of germs on your skin by washing with CHG (chlorahexidine gluconate) soap before surgery.  CHG is an antiseptic cleaner which kills germs and bonds with the skin to continue killing germs even after washing. Please DO NOT use if you have an allergy to CHG or antibacterial soaps.  If your skin becomes reddened/irritated stop using the CHG and inform your nurse when you arrive at Short Stay. Do not shave (including legs and underarms) for at least 48 hours prior to the first CHG shower.  You may shave your face/neck. Please follow these  instructions carefully:  1.  Shower with CHG Soap the night before surgery and the  morning of Surgery.  2.  If you choose to wash your hair, wash your hair first as usual with your  normal  shampoo.  3.  After you shampoo, rinse your hair and body thoroughly to remove the  shampoo.                           4.  Use CHG as you would any other liquid soap.  You can apply chg directly  to the skin and wash                       Gently with a scrungie or clean washcloth.  5.  Apply the CHG Soap to your body ONLY FROM THE NECK DOWN.   Do not use on face/ open                           Wound or open sores. Avoid contact with eyes, ears mouth and genitals (private parts).                       Wash face,  Genitals (private parts) with your normal soap.             6.  Wash thoroughly, paying special attention to the area where your surgery  will be performed.  7.  Thoroughly rinse your body with warm water from the neck down.  8.  DO NOT shower/wash with your normal soap after using and rinsing off  the CHG Soap.                9.  Pat yourself dry with a clean towel.            10.  Wear clean pajamas.            11.  Place clean sheets on your bed the night of your first shower and do not  sleep with pets. Day of Surgery : Do not apply  any lotions/deodorants the morning of surgery.  Please wear clean clothes to the hospital/surgery center.  FAILURE TO FOLLOW THESE INSTRUCTIONS MAY RESULT IN THE CANCELLATION OF YOUR SURGERY PATIENT SIGNATURE_________________________________  NURSE SIGNATURE__________________________________  ________________________________________________________________________

## 2024-05-01 NOTE — Progress Notes (Addendum)
 PCP - Berneda Bridges, MD  Cardiologist - no  PPM/ICD -  Device Orders -  Rep Notified -   Chest x-ray - 1V 07-07-23 epic EKG -05-02-24 PREOP Stress Test -  ECHO -  Cardiac Cath -   Sleep Study - yes CPAP - No  Fasting Blood Sugar -  Checks Blood Sugar _0____ times a day  Blood Thinner Instructions: Aspirin  Instructions:no  ERAS Protcol -N/A PRE-SURGERY  Ozempic   last dose- 05-01-24  COVID vaccine -no  Activity--Able to complete ADL's with no CP or SOB with assistance from wife Anesthesia review: HTN, asthma, OSA no cpap , CKD stage 3, Lumbar spinal stenosis, pre DM. Creatine 1.81  Patient denies shortness of breath, fever, cough and chest pain at PAT appointment   All instructions explained to the patient, with a verbal understanding of the material. Patient agrees to go over the instructions while at home for a better understanding. Patient also instructed to self quarantine after being tested for COVID-19. The opportunity to ask questions was provided.

## 2024-05-02 ENCOUNTER — Encounter (HOSPITAL_COMMUNITY)
Admission: RE | Admit: 2024-05-02 | Discharge: 2024-05-02 | Disposition: A | Discharge status: Home / Self Care | Source: Ambulatory Visit | Attending: Urology | Admitting: Urology

## 2024-05-02 ENCOUNTER — Other Ambulatory Visit: Payer: Self-pay

## 2024-05-02 ENCOUNTER — Encounter (HOSPITAL_COMMUNITY): Payer: Self-pay

## 2024-05-02 VITALS — BP 157/97 | HR 73 | Temp 98.2°F | Resp 16 | Ht 71.0 in | Wt 341.0 lb

## 2024-05-02 DIAGNOSIS — I10 Essential (primary) hypertension: Secondary | ICD-10-CM | POA: Diagnosis not present

## 2024-05-02 DIAGNOSIS — Z01810 Encounter for preprocedural cardiovascular examination: Secondary | ICD-10-CM | POA: Diagnosis present

## 2024-05-02 DIAGNOSIS — Z01818 Encounter for other preprocedural examination: Secondary | ICD-10-CM | POA: Diagnosis not present

## 2024-05-02 DIAGNOSIS — R9431 Abnormal electrocardiogram [ECG] [EKG]: Secondary | ICD-10-CM | POA: Insufficient documentation

## 2024-05-02 DIAGNOSIS — Z01812 Encounter for preprocedural laboratory examination: Secondary | ICD-10-CM | POA: Diagnosis present

## 2024-05-02 HISTORY — DX: Cardiomegaly: I51.7

## 2024-05-02 HISTORY — DX: Prediabetes: R73.03

## 2024-05-02 HISTORY — DX: Chronic kidney disease, unspecified: N18.9

## 2024-05-02 HISTORY — DX: Unspecified osteoarthritis, unspecified site: M19.90

## 2024-05-02 HISTORY — DX: Pneumonia, unspecified organism: J18.9

## 2024-05-02 LAB — CBC
HCT: 44.2 % (ref 39.0–52.0)
Hemoglobin: 14 g/dL (ref 13.0–17.0)
MCH: 28.7 pg (ref 26.0–34.0)
MCHC: 31.7 g/dL (ref 30.0–36.0)
MCV: 90.6 fL (ref 80.0–100.0)
Platelets: 239 10*3/uL (ref 150–400)
RBC: 4.88 MIL/uL (ref 4.22–5.81)
RDW: 14.9 % (ref 11.5–15.5)
WBC: 6.8 10*3/uL (ref 4.0–10.5)
nRBC: 0 % (ref 0.0–0.2)

## 2024-05-02 LAB — BASIC METABOLIC PANEL WITH GFR
Anion gap: 8 (ref 5–15)
BUN: 33 mg/dL — ABNORMAL HIGH (ref 6–20)
CO2: 28 mmol/L (ref 22–32)
Calcium: 9.4 mg/dL (ref 8.9–10.3)
Chloride: 101 mmol/L (ref 98–111)
Creatinine, Ser: 1.81 mg/dL — ABNORMAL HIGH (ref 0.61–1.24)
GFR, Estimated: 42 mL/min — ABNORMAL LOW (ref >60)
Glucose, Bld: 91 mg/dL (ref 70–99)
Potassium: 3.6 mmol/L (ref 3.5–5.1)
Sodium: 137 mmol/L (ref 135–145)

## 2024-05-08 ENCOUNTER — Other Ambulatory Visit: Payer: Self-pay

## 2024-05-08 ENCOUNTER — Ambulatory Visit (HOSPITAL_COMMUNITY)
Admission: RE | Admit: 2024-05-08 | Discharge: 2024-05-08 | Disposition: A | Discharge status: Home / Self Care | Attending: Urology | Admitting: Urology

## 2024-05-08 ENCOUNTER — Encounter (HOSPITAL_COMMUNITY)
Admission: RE | Disposition: A | Discharge status: Home / Self Care | Payer: Self-pay | Source: Home / Self Care | Attending: Urology

## 2024-05-08 ENCOUNTER — Ambulatory Visit (HOSPITAL_BASED_OUTPATIENT_CLINIC_OR_DEPARTMENT_OTHER): Admitting: Anesthesiology

## 2024-05-08 ENCOUNTER — Ambulatory Visit (HOSPITAL_COMMUNITY): Payer: Self-pay | Admitting: Physician Assistant

## 2024-05-08 ENCOUNTER — Encounter (HOSPITAL_COMMUNITY): Payer: Self-pay | Admitting: Urology

## 2024-05-08 DIAGNOSIS — G4730 Sleep apnea, unspecified: Secondary | ICD-10-CM | POA: Insufficient documentation

## 2024-05-08 DIAGNOSIS — M1990 Unspecified osteoarthritis, unspecified site: Secondary | ICD-10-CM | POA: Diagnosis not present

## 2024-05-08 DIAGNOSIS — I129 Hypertensive chronic kidney disease with stage 1 through stage 4 chronic kidney disease, or unspecified chronic kidney disease: Secondary | ICD-10-CM

## 2024-05-08 DIAGNOSIS — Z6841 Body Mass Index (BMI) 40.0 and over, adult: Secondary | ICD-10-CM | POA: Insufficient documentation

## 2024-05-08 DIAGNOSIS — N183 Chronic kidney disease, stage 3 unspecified: Secondary | ICD-10-CM

## 2024-05-08 DIAGNOSIS — N471 Phimosis: Secondary | ICD-10-CM | POA: Diagnosis present

## 2024-05-08 DIAGNOSIS — I10 Essential (primary) hypertension: Secondary | ICD-10-CM | POA: Insufficient documentation

## 2024-05-08 DIAGNOSIS — F418 Other specified anxiety disorders: Secondary | ICD-10-CM

## 2024-05-08 DIAGNOSIS — E66813 Obesity, class 3: Secondary | ICD-10-CM | POA: Insufficient documentation

## 2024-05-08 HISTORY — PX: CIRCUMCISION: SHX1350

## 2024-05-08 LAB — GLUCOSE, CAPILLARY: Glucose-Capillary: 94 mg/dL (ref 70–99)

## 2024-05-08 SURGERY — CIRCUMCISION, ADULT
Anesthesia: General | Site: Penis

## 2024-05-08 MED ORDER — BUPIVACAINE HCL (PF) 0.25 % IJ SOLN
INTRAMUSCULAR | Status: AC
  Filled 2024-05-08: qty 30

## 2024-05-08 MED ORDER — BUPIVACAINE HCL (PF) 0.25 % IJ SOLN
INTRAMUSCULAR | Status: DC | PRN
Start: 2024-05-08 — End: 2024-05-08
  Administered 2024-05-08: 10 mL

## 2024-05-08 MED ORDER — DIPHENHYDRAMINE HCL 50 MG/ML IJ SOLN
12.5000 mg | Freq: Once | INTRAMUSCULAR | Status: AC
  Administered 2024-05-08: 12.5 mg via INTRAVENOUS

## 2024-05-08 MED ORDER — PROPOFOL 10 MG/ML IV BOLUS
INTRAVENOUS | Status: AC
  Filled 2024-05-08: qty 20

## 2024-05-08 MED ORDER — MIDAZOLAM HCL 2 MG/2ML IJ SOLN
INTRAMUSCULAR | Status: AC
  Filled 2024-05-08: qty 2

## 2024-05-08 MED ORDER — CEFAZOLIN SODIUM-DEXTROSE 3-4 GM/150ML-% IV SOLN
3.0000 g | INTRAVENOUS | Status: AC
  Administered 2024-05-08: 3 g via INTRAVENOUS
  Filled 2024-05-08: qty 150

## 2024-05-08 MED ORDER — EPHEDRINE SULFATE-NACL 50-0.9 MG/10ML-% IV SOSY
PREFILLED_SYRINGE | INTRAVENOUS | Status: DC | PRN
  Administered 2024-05-08: 5 mg via INTRAVENOUS

## 2024-05-08 MED ORDER — DIPHENHYDRAMINE HCL 50 MG/ML IJ SOLN
INTRAMUSCULAR | Status: AC
  Filled 2024-05-08: qty 1

## 2024-05-08 MED ORDER — OXYCODONE HCL 5 MG PO TABS: 5.0000 mg | ORAL_TABLET | Freq: Once | ORAL | Status: DC | PRN

## 2024-05-08 MED ORDER — AMISULPRIDE (ANTIEMETIC) 5 MG/2ML IV SOLN: 10.0000 mg | Freq: Once | INTRAVENOUS | Status: DC | PRN

## 2024-05-08 MED ORDER — PROPOFOL 10 MG/ML IV BOLUS
INTRAVENOUS | Status: DC | PRN
  Administered 2024-05-08: 200 mg via INTRAVENOUS

## 2024-05-08 MED ORDER — DEXAMETHASONE SODIUM PHOSPHATE 4 MG/ML IJ SOLN
INTRAMUSCULAR | Status: DC | PRN
Start: 2024-05-08 — End: 2024-05-08
  Administered 2024-05-08: 5 mg via INTRAVENOUS

## 2024-05-08 MED ORDER — SODIUM CHLORIDE 0.9 % IV SOLN: 12.5000 mg | INTRAVENOUS | Status: DC | PRN

## 2024-05-08 MED ORDER — FENTANYL CITRATE (PF) 250 MCG/5ML IJ SOLN
INTRAMUSCULAR | Status: AC
  Filled 2024-05-08: qty 5

## 2024-05-08 MED ORDER — CHLORHEXIDINE GLUCONATE 0.12 % MT SOLN: 15.0000 mL | Freq: Once | OROMUCOSAL | Status: DC

## 2024-05-08 MED ORDER — STERILE WATER FOR IRRIGATION IR SOLN
Status: DC | PRN
  Administered 2024-05-08: 1000 mL

## 2024-05-08 MED ORDER — FENTANYL CITRATE PF 50 MCG/ML IJ SOSY: 25.0000 ug | PREFILLED_SYRINGE | INTRAMUSCULAR | Status: DC | PRN

## 2024-05-08 MED ORDER — DEXAMETHASONE SODIUM PHOSPHATE 10 MG/ML IJ SOLN
INTRAMUSCULAR | Status: AC
  Filled 2024-05-08: qty 1

## 2024-05-08 MED ORDER — FENTANYL CITRATE (PF) 100 MCG/2ML IJ SOLN
INTRAMUSCULAR | Status: DC | PRN
  Administered 2024-05-08: 50 ug via INTRAVENOUS

## 2024-05-08 MED ORDER — LACTATED RINGERS IV SOLN: INTRAVENOUS | Status: DC

## 2024-05-08 MED ORDER — ONDANSETRON HCL 4 MG/2ML IJ SOLN
INTRAMUSCULAR | Status: DC | PRN
  Administered 2024-05-08: 4 mg via INTRAVENOUS

## 2024-05-08 MED ORDER — EPHEDRINE 5 MG/ML INJ
INTRAVENOUS | Status: AC
  Filled 2024-05-08: qty 5

## 2024-05-08 MED ORDER — 0.9 % SODIUM CHLORIDE (POUR BTL) OPTIME
TOPICAL | Status: DC | PRN
  Administered 2024-05-08: 1000 mL

## 2024-05-08 MED ORDER — OXYCODONE HCL 5 MG/5ML PO SOLN: 5.0000 mg | Freq: Once | ORAL | Status: DC | PRN

## 2024-05-08 MED ORDER — INSULIN ASPART 100 UNIT/ML IJ SOLN: 0.0000 [IU] | INTRAMUSCULAR | Status: DC | PRN

## 2024-05-08 MED ORDER — ORAL CARE MOUTH RINSE: 15.0000 mL | Freq: Once | OROMUCOSAL | Status: DC

## 2024-05-08 MED ORDER — LIDOCAINE HCL (PF) 2 % IJ SOLN
INTRAMUSCULAR | Status: AC
  Filled 2024-05-08: qty 5

## 2024-05-08 MED ORDER — ONDANSETRON HCL 4 MG/2ML IJ SOLN
INTRAMUSCULAR | Status: AC
  Filled 2024-05-08: qty 2

## 2024-05-08 MED ORDER — LIDOCAINE HCL (CARDIAC) PF 100 MG/5ML IV SOSY
PREFILLED_SYRINGE | INTRAVENOUS | Status: DC | PRN
  Administered 2024-05-08: 100 mg via INTRAVENOUS

## 2024-05-08 SURGICAL SUPPLY — 23 items
BAG COUNTER SPONGE SURGICOUNT (BAG) IMPLANT
BLADE SURG 15 STRL LF DISP TIS (BLADE) ×1 IMPLANT
BNDG COHESIVE 1.5X5 TAN NS LF (GAUZE/BANDAGES/DRESSINGS) ×1 IMPLANT
BNDG COHESIVE 2X5 TAN ST LF (GAUZE/BANDAGES/DRESSINGS) IMPLANT
COVER SURGICAL LIGHT HANDLE (MISCELLANEOUS) ×1 IMPLANT
DRAPE LAPAROTOMY T 98X78 PEDS (DRAPES) ×1 IMPLANT
ELECT NDL TIP 2.8 STRL (NEEDLE) ×1 IMPLANT
ELECT NEEDLE TIP 2.8 STRL (NEEDLE) ×1 IMPLANT
ELECT PENCIL ROCKER SW 15FT (MISCELLANEOUS) ×1 IMPLANT
ELECT REM PT RETURN 15FT ADLT (MISCELLANEOUS) ×1 IMPLANT
GAUZE PETROLATUM 1 X8 (GAUZE/BANDAGES/DRESSINGS) ×1 IMPLANT
GAUZE STRETCH 2X75IN STRL (MISCELLANEOUS) ×1 IMPLANT
GLOVE SURG LX STRL 8.0 MICRO (GLOVE) ×1 IMPLANT
GOWN STRL REUS W/ TWL LRG LVL3 (GOWN DISPOSABLE) ×1 IMPLANT
KIT BASIN OR (CUSTOM PROCEDURE TRAY) ×1 IMPLANT
KIT TURNOVER KIT A (KITS) IMPLANT
NS IRRIG 1000ML POUR BTL (IV SOLUTION) ×1 IMPLANT
PACK BASIC VI WITH GOWN DISP (CUSTOM PROCEDURE TRAY) ×1 IMPLANT
SUT CHROMIC 3 0 SH 27 (SUTURE) ×2 IMPLANT
SUT PROLENE 4 0 P 3 18 (SUTURE) IMPLANT
SYR CONTROL 10ML LL (SYRINGE) ×1 IMPLANT
TOWEL OR 17X26 10 PK STRL BLUE (TOWEL DISPOSABLE) IMPLANT
WATER STERILE IRR 1000ML POUR (IV SOLUTION) IMPLANT

## 2024-05-08 NOTE — Anesthesia Postprocedure Evaluation (Signed)
 Anesthesia Post Note  Patient: Michael Holland  Procedure(s) Performed: CIRCUMCISION, ADULT (Penis)     Patient location during evaluation: PACU Anesthesia Type: General Level of consciousness: awake and alert Pain management: pain level controlled Vital Signs Assessment: post-procedure vital signs reviewed and stable Respiratory status: spontaneous breathing, nonlabored ventilation and respiratory function stable Cardiovascular status: blood pressure returned to baseline and stable Postop Assessment: no apparent nausea or vomiting Anesthetic complications: no   No notable events documented.  Last Vitals:  Vitals:   05/08/24 1300 05/08/24 1322  BP: (!) 145/85 138/82  Pulse: 65 64  Resp: 15 18  Temp:  (!) 36.4 C  SpO2: 95% 96%    Last Pain:  Vitals:   05/08/24 1322  TempSrc: Oral  PainSc: 3                  Earvin Goldberg

## 2024-05-08 NOTE — Op Note (Signed)
 Operative Note  Preoperative diagnosis:  1.  Phimosis  Postoperative diagnosis: 1.  Phimosis  Procedure(s): 1.  Adult circumcision  Surgeon: Yevonne Heman, MD  Assistants:  None  Anesthesia:  General  Complications:  None  EBL: Less than 5 mL  Specimens: 1.  Foreskin  Drains/Catheters: 1.  None  Intraoperative findings:   Phimosis penile foreskin  Indication:  Michael Holland is a 63 y.o. male with phimosis of the penile foreskin.  He has been consented for the above procedures, voices understanding and wishes to proceed.  Description of procedure:  After informed consent was obtained, the patient was brought to the operating room and general LMA anesthesia was administered.  The patient was prepped and draped in the usual fashion.  A timeout was then performed. Circumferential marks were then made with the first being with the foreskin in the anatomic position along the glandular impression on the second being with the foreskin retracted approximately 1 cm from the coronal sulcus.  The marks were then incised using electrocautery and the excess foreskin was excised.  The penile shaft skin was then reapproximated using interrupted 3-0 chromic suture.  A penile ring block was then performed using quarter percent Marcaine  without epinephrine .  The penis was then dressed in the usual fashion.  The patient tolerated the procedure well and was transferred to the postanesthesia unit in stable condition.   Plan:  Discharge home

## 2024-05-08 NOTE — Anesthesia Preprocedure Evaluation (Signed)
 Anesthesia Evaluation  Patient identified by MRN, date of birth, ID band Patient awake    Reviewed: Allergy & Precautions, NPO status , Patient's Chart, lab work & pertinent test results  Airway Mallampati: III  TM Distance: >3 FB Neck ROM: Full    Dental  (+) Teeth Intact, Dental Advisory Given   Pulmonary asthma , sleep apnea    breath sounds clear to auscultation       Cardiovascular hypertension, Pt. on medications  Rhythm:Regular Rate:Normal     Neuro/Psych   Anxiety Depression       GI/Hepatic ,GERD  ,,  Endo/Other    Class 3 obesity  Renal/GU Renal InsufficiencyRenal disease     Musculoskeletal  (+) Arthritis , Osteoarthritis,    Abdominal  (+) + obese  Peds  Hematology   Anesthesia Other Findings   Reproductive/Obstetrics                             Anesthesia Physical Anesthesia Plan  ASA: III  Anesthesia Plan: General   Post-op Pain Management:    Induction: Intravenous  PONV Risk Score and Plan: 2 and Ondansetron , Midazolam  and Treatment may vary due to age or medical condition  Airway Management Planned: LMA  Additional Equipment:   Intra-op Plan:   Post-operative Plan: Extubation in OR  Informed Consent: I have reviewed the patients History and Physical, chart, labs and discussed the procedure including the risks, benefits and alternatives for the proposed anesthesia with the patient or authorized representative who has indicated his/her understanding and acceptance.     Dental advisory given  Plan Discussed with: CRNA and Anesthesiologist  Anesthesia Plan Comments:         Anesthesia Quick Evaluation

## 2024-05-08 NOTE — H&P (Signed)
 Office Visit Report     04/22/2024   --------------------------------------------------------------------------------   Michael Holland  MRN: [id number]  DOB: August 19, 1961, 63 year old Male  SSN:    PRIMARY CARE:  Scott D. Long, PA  PRIMARY CARE FAX:  [phone]  REFERRING:    PROVIDER:  Yevonne Heman, M.D.  LOCATION:  [facility] [phone]     --------------------------------------------------------------------------------   CC: I have trouble rolling my foreskin back.  HPI: Michael Holland is a 63 year-old male patient who is here for trouble with rollling back his foreskin.  He has had difficulties with rolling his foreskin back for 1 year. He was last able to easily roll his foreskin back approximately 04/12/2023. His symptoms have gotten worse over the last year.   He does not have dysuria. He has had inflammation of the head of his penis. He has had to use creams on the head of his penis.   He does not have to strain or bear down to start his urinary stream. He does have a good size and strength to his urinary stream. His urinary stream does not start and stop during voiding.   He would like to be circumcised.     ALLERGIES: None   MEDICATIONS: amLODIPine  Besylate 10 MG Tablet  Colchicine 0.6 MG Tablet  Metoprolol  Succinate ER 200 MG Tablet Extended Release 24 Hour  oxyCODONE -Acetaminophen  10-325 MG Tablet  Ozempic  (2 MG/DOSE)  Valsartan -hydroCHLOROthiazide      GU PSH: None   NON-GU PSH: None   GU PMH: None   NON-GU PMH: Arthritis Glaucoma Hypercholesterolemia Hypertension Sleep Apnea    FAMILY HISTORY: 1 Daughter - Daughter 2 sons - Son   SOCIAL HISTORY: Marital Status: Married Preferred Language: English; Race: Black or African American Current Smoking Status: Patient has never smoked.   Tobacco Use Assessment Completed: Used Tobacco in last 30 days? Does drink.  Drinks 1 caffeinated drink per day.    REVIEW OF SYSTEMS:     GU Review Male:   Patient reports frequent urination and get up at night to urinate. Patient denies hard to postpone urination, burning/ pain with urination, leakage of urine, stream starts and stops, trouble starting your stream, have to strain to urinate , erection problems, and penile pain.  Gastrointestinal (Upper):   Patient denies nausea, vomiting, and indigestion/ heartburn.  Gastrointestinal (Lower):   Patient denies diarrhea and constipation.  Constitutional:   Patient denies fever, night sweats, weight loss, and fatigue.  Skin:   Patient denies skin rash/ lesion and itching.  Eyes:   Patient denies blurred vision and double vision.  Ears/ Nose/ Throat:   Patient denies sore throat and sinus problems.  Hematologic/Lymphatic:   Patient denies swollen glands and easy bruising.  Cardiovascular:   Patient denies leg swelling and chest pains.  Respiratory:   Patient denies cough and shortness of breath.  Endocrine:   Patient denies excessive thirst.  Musculoskeletal:   Patient denies back pain and joint pain.  Neurological:   Patient denies dizziness and headaches.  Psychologic:   Patient denies depression and anxiety.   VITAL SIGNS:      04/22/2024 09:37 AM  Weight 340 lb / 154.22 kg  Height 71 in / 180.34 cm  BP 159/104 mmHg  Pulse 88 /min  BMI 47.4 kg/m   GU PHYSICAL EXAMINATION:    Scrotum: No lesions. No edema. No cysts. No warts.  Epididymides: Right: no spermatocele, no masses, no cysts, no tenderness, no induration, no enlargement.  Left: no spermatocele, no masses, no cysts, no tenderness, no induration, no enlargement.  Testes: No tenderness, no swelling, no enlargement left testes. No tenderness, no swelling, no enlargement right testes. Normal location left testes. Normal location right testes. No mass, no cyst, no varicocele, no hydrocele left testes. No mass, no cyst, no varicocele, no hydrocele right testes.  Urethral Meatus: Normal size. No lesion, no wart, no  discharge, no polyp. Normal location.  Penis: Penis uncircumcised, phimosis. No foreskin warts, no cracks. No dorsal peyronie's plaques, no left corporal peyronie's plaques, no right corporal peyronie's plaques, no scarring, no shaft warts. No balanitis, no meatal stenosis.    MULTI-SYSTEM PHYSICAL EXAMINATION:    Constitutional: Well-nourished. No physical deformities. Normally developed. Good grooming.  Neurologic / Psychiatric: Oriented to time, oriented to place, oriented to person. No depression, no anxiety, no agitation.  Gastrointestinal: Obese abdomen. No mass, no tenderness, no rigidity.      PAST DATA REVIEW: None   PROCEDURES: None   ASSESSMENT:      ICD-10 Details  1 GU:   Phimosis - N47.1 Undiagnosed New Problem   PLAN:           Schedule Return Visit/Planned Activity: Next Available Appointment - Schedule Surgery          Document Letter(s):  Created for Patient: Clinical Summary         Notes:   -The risks, benefits and alternatives of adult circumcision was discussed with the patient. The risks included, but are not limited to, bleeding, infection, pain, MI, CVA, DVT, PE and the inherent risks of general anesthesia.

## 2024-05-08 NOTE — Transfer of Care (Signed)
 Immediate Anesthesia Transfer of Care Note  Patient: Michael Holland  Procedure(s) Performed: CIRCUMCISION, ADULT (Penis)  Patient Location: PACU  Anesthesia Type:General  Level of Consciousness: awake, alert , oriented, and patient cooperative  Airway & Oxygen Therapy: Patient Spontanous Breathing and Patient connected to face mask oxygen  Post-op Assessment: Report given to RN, Post -op Vital signs reviewed and stable, and Patient moving all extremities  Post vital signs: Reviewed and stable  Last Vitals:  Vitals Value Taken Time  BP 151/75 05/08/24 1206  Temp    Pulse 70 05/08/24 1207  Resp 16 05/08/24 1207  SpO2 100 % 05/08/24 1207  Vitals shown include unfiled device data.  Last Pain:  Vitals:   05/08/24 0911  TempSrc:   PainSc: 6          Complications: No notable events documented.

## 2024-05-09 ENCOUNTER — Encounter (HOSPITAL_COMMUNITY): Payer: Self-pay | Admitting: Urology

## 2024-05-20 ENCOUNTER — Encounter (HOSPITAL_COMMUNITY): Payer: Self-pay

## 2024-05-20 ENCOUNTER — Emergency Department (HOSPITAL_COMMUNITY)
Admission: EM | Admit: 2024-05-20 | Discharge: 2024-05-20 | Disposition: A | Discharge status: Home / Self Care | Attending: Emergency Medicine | Admitting: Emergency Medicine

## 2024-05-20 ENCOUNTER — Emergency Department (HOSPITAL_BASED_OUTPATIENT_CLINIC_OR_DEPARTMENT_OTHER)
Admit: 2024-05-20 | Discharge: 2024-05-20 | Disposition: A | Discharge status: Home / Self Care | Attending: Emergency Medicine | Admitting: Emergency Medicine

## 2024-05-20 ENCOUNTER — Other Ambulatory Visit: Payer: Self-pay

## 2024-05-20 DIAGNOSIS — I2510 Atherosclerotic heart disease of native coronary artery without angina pectoris: Secondary | ICD-10-CM | POA: Insufficient documentation

## 2024-05-20 DIAGNOSIS — Z91040 Latex allergy status: Secondary | ICD-10-CM | POA: Insufficient documentation

## 2024-05-20 DIAGNOSIS — R531 Weakness: Secondary | ICD-10-CM | POA: Diagnosis not present

## 2024-05-20 DIAGNOSIS — Z79899 Other long term (current) drug therapy: Secondary | ICD-10-CM | POA: Diagnosis not present

## 2024-05-20 DIAGNOSIS — M10041 Idiopathic gout, right hand: Secondary | ICD-10-CM | POA: Diagnosis not present

## 2024-05-20 DIAGNOSIS — N4889 Other specified disorders of penis: Secondary | ICD-10-CM | POA: Insufficient documentation

## 2024-05-20 DIAGNOSIS — M7989 Other specified soft tissue disorders: Secondary | ICD-10-CM | POA: Diagnosis not present

## 2024-05-20 DIAGNOSIS — N189 Chronic kidney disease, unspecified: Secondary | ICD-10-CM | POA: Diagnosis not present

## 2024-05-20 DIAGNOSIS — I129 Hypertensive chronic kidney disease with stage 1 through stage 4 chronic kidney disease, or unspecified chronic kidney disease: Secondary | ICD-10-CM | POA: Diagnosis not present

## 2024-05-20 DIAGNOSIS — M79601 Pain in right arm: Secondary | ICD-10-CM | POA: Diagnosis present

## 2024-05-20 LAB — CBC
HCT: 44.2 % (ref 39.0–52.0)
Hemoglobin: 14.3 g/dL (ref 13.0–17.0)
MCH: 28.7 pg (ref 26.0–34.0)
MCHC: 32.4 g/dL (ref 30.0–36.0)
MCV: 88.6 fL (ref 80.0–100.0)
Platelets: 265 10*3/uL (ref 150–400)
RBC: 4.99 MIL/uL (ref 4.22–5.81)
RDW: 14.6 % (ref 11.5–15.5)
WBC: 10.3 10*3/uL (ref 4.0–10.5)
nRBC: 0 % (ref 0.0–0.2)

## 2024-05-20 LAB — COMPREHENSIVE METABOLIC PANEL WITH GFR
ALT: 15 U/L (ref 0–44)
AST: 17 U/L (ref 15–41)
Albumin: 3.4 g/dL — ABNORMAL LOW (ref 3.5–5.0)
Alkaline Phosphatase: 55 U/L (ref 38–126)
Anion gap: 12 (ref 5–15)
BUN: 27 mg/dL — ABNORMAL HIGH (ref 6–20)
CO2: 25 mmol/L (ref 22–32)
Calcium: 9.3 mg/dL (ref 8.9–10.3)
Chloride: 100 mmol/L (ref 98–111)
Creatinine, Ser: 2.16 mg/dL — ABNORMAL HIGH (ref 0.61–1.24)
GFR, Estimated: 34 mL/min — ABNORMAL LOW (ref >60)
Glucose, Bld: 121 mg/dL — ABNORMAL HIGH (ref 70–99)
Potassium: 4 mmol/L (ref 3.5–5.1)
Sodium: 137 mmol/L (ref 135–145)
Total Bilirubin: 1 mg/dL (ref 0.0–1.2)
Total Protein: 9 g/dL — ABNORMAL HIGH (ref 6.5–8.1)

## 2024-05-20 LAB — CBG MONITORING, ED: Glucose-Capillary: 116 mg/dL — ABNORMAL HIGH (ref 70–99)

## 2024-05-20 MED ORDER — ALLOPURINOL 100 MG PO TABS: 100.0000 mg | ORAL_TABLET | Freq: Once | ORAL | Status: DC

## 2024-05-20 MED ORDER — ALLOPURINOL 20 MG/ML ORAL SUSPENSION: 100.0000 mg | Freq: Once | ORAL | Status: DC

## 2024-05-20 MED ORDER — MORPHINE SULFATE (PF) 4 MG/ML IV SOLN
4.0000 mg | Freq: Once | INTRAVENOUS | Status: AC
  Administered 2024-05-20: 4 mg via INTRAVENOUS
  Filled 2024-05-20: qty 1

## 2024-05-20 MED ORDER — KETOROLAC TROMETHAMINE 30 MG/ML IJ SOLN
15.0000 mg | Freq: Once | INTRAMUSCULAR | Status: AC
  Administered 2024-05-20: 15 mg via INTRAVENOUS
  Filled 2024-05-20: qty 1

## 2024-05-20 NOTE — ED Triage Notes (Addendum)
 Pt arrives via POV. Pt reports he had a circumcision on 05/08/24. Pt reports since this past Friday he has been experiencing pain and swelling to his right arm, weakness to his right leg and right arm, fatigue, pain at circumcision site, and dark urine. Pt is AxOx4. Denies cp or sob. No other focal neurological symptoms noted.

## 2024-05-20 NOTE — Progress Notes (Signed)
 Right upper extremity venous duplex has been completed. Preliminary results can be found in CV Proc through chart review.  Results were given to Dr. Liam Redhead.  05/20/24 12:41 PM Birda Buffy RVT

## 2024-05-20 NOTE — ED Notes (Signed)
 Pt states the doctor told him he could go home, and he did not want to wait for d/c papers- states he will just go by his pharmacy later for prescription

## 2024-05-20 NOTE — ED Provider Notes (Signed)
 [place] EMERGENCY DEPARTMENT AT [facility] Provider Note   CSN: [id number] Arrival date & time: 05/20/24  1038     History  Chief Complaint  Patient presents with   Post-op Problem   Weakness   Arm Pain    Michael Holland is a 63 y.o. male.  HPI Patient presents with right arm pain, generalized weakness, edema, and pain around his penis.  The patient's history is notable for CAD, hypertension, CKD, and elective circumcision 10 days ago.  He is scheduled follow-up with urology tomorrow.  He notes over the past 2 days he has had worsening pain in his right arm no erythema, no fever, no relief with OTC medication.  No dysuria or other scrotal complaints.    Home Medications Prior to Admission medications   Medication Sig Start Date End Date Taking? Authorizing Provider  allopurinol (ZYLOPRIM) 300 MG tablet Take 300 mg by mouth daily.    [provider]  amLODipine  (NORVASC ) 10 MG tablet Take 10 mg by mouth daily. 03/02/17   [provider]  atorvastatin (LIPITOR) 10 MG tablet Take 10 mg by mouth at bedtime. 11/30/23   [provider]  bisacodyl  (DULCOLAX) 5 MG EC tablet Take 5-10 mg by mouth daily as needed for moderate constipation.    [provider]  Celery Seed OIL Take 1 Dose by mouth daily.    [provider]  Cholecalciferol (VITAMIN D3 PO) Take 1-2 Doses by mouth daily as needed (Supplement).    [provider]  colchicine 0.6 MG tablet Take 0.6 mg by mouth daily as needed. 04/23/24   [provider]  Cyanocobalamin (B-12 PO) Take 1 tablet by mouth daily.    [provider]  Ferrous Sulfate (IRON PO) Take 1 tablet by mouth daily.    [provider]  ibuprofen  (ADVIL ,MOTRIN ) 200 MG tablet Take 2 tablets (400 mg total) by mouth every 8 (eight) hours as needed for headache (for pain.). May resume 5 days post-op as needed. 03/30/17   Bissell, Jaclyn M, PA-C  metaxalone (SKELAXIN) 800 MG  tablet Take 800 mg by mouth 3 (three) times daily as needed for muscle spasms. 03/22/24   [provider]  metoprolol  (TOPROL -XL) 200 MG 24 hr tablet Take 200 mg by mouth daily. 03/03/17   [provider]  ondansetron  (ZOFRAN ) 4 MG tablet Take 1 tablet (4 mg total) by mouth 3-4 times daily as needed for nausea. 02/12/23     oxyCODONE -acetaminophen  (PERCOCET) 10-325 MG tablet Take 1-2 tablets by mouth every 4 (four) hours as needed for pain (severe). Patient taking differently: Take 1 tablet by mouth every 4 (four) hours as needed for pain. 03/29/17   Orvan Blanch, MD  OZEMPIC , 2 MG/DOSE, 8 MG/3ML SOPN Inject 2 mg into the skin once a week. 02/20/24   [provider]  polyethylene glycol (MIRALAX  / GLYCOLAX ) packet Take 17 g by mouth daily. Patient not taking: Reported on 04/26/2024 03/29/17   Orvan Blanch, MD  rosuvastatin (CRESTOR) 10 MG tablet Take 10 mg by mouth daily. 02/19/24   [provider]  tiZANidine (ZANAFLEX) 2 MG tablet Take 2 mg by mouth at bedtime as needed for muscle spasms. 04/30/24   [provider]  valsartan -hydrochlorothiazide  (DIOVAN -HCT) 320-25 MG tablet Take 1 tablet by mouth daily. 03/02/17   [provider]      Allergies    Hydromorphone , Latex, and Other    Review of Systems   Review of Systems  Physical  Exam Updated Vital Signs BP (!) 155/86   Pulse 82   Temp 98.7 F (37.1 C) (Oral)   Resp 17   Ht 5\' 11"  (1.803 m)   Wt (!) 152 kg   SpO2 98%   BMI 46.72 kg/m  Physical Exam Vitals and nursing note reviewed. Exam conducted with a chaperone present (Adult sons just outside the doorway).  Constitutional:      General: He is not in acute distress.    Appearance: He is well-developed.  HENT:     Head: Normocephalic and atraumatic.  Eyes:     Conjunctiva/sclera: Conjunctivae normal.  Cardiovascular:     Rate and Rhythm: Normal rate and regular rhythm.  Pulmonary:     Effort: Pulmonary effort is normal. No  respiratory distress.     Breath sounds: No stridor.  Abdominal:     General: There is no distension.  Genitourinary:   Musculoskeletal:     Comments: Extremity edema, tender to palpation right wrist, no warmth.  Skin:    General: Skin is warm and dry.  Neurological:     Mental Status: He is alert and oriented to person, place, and time.     ED Results / Procedures / Treatments   Labs (all labs ordered are listed, but only abnormal results are displayed) Labs Reviewed  COMPREHENSIVE METABOLIC PANEL WITH GFR - Abnormal; Notable for the following components:      Result Value   Glucose, Bld 121 (*)    BUN 27 (*)    Creatinine, Ser 2.16 (*)    Total Protein 9.0 (*)    Albumin 3.4 (*)    GFR, Estimated 34 (*)    All other components within normal limits  CBG MONITORING, ED - Abnormal; Notable for the following components:   Glucose-Capillary 116 (*)    All other components within normal limits  CBC  URINALYSIS, ROUTINE W REFLEX MICROSCOPIC    EKG EKG Interpretation Date/Time:  Monday May 20 2024 11:07:35 EDT Ventricular Rate:  99 PR Interval:  237 QRS Duration:  94 QT Interval:  340 QTC Calculation: 437 R Axis:   -66  Text Interpretation: Sinus rhythm Prolonged PR interval Probable left atrial enlargement LAD, consider left anterior fascicular block Abnormal R-wave progression, late transition Borderline ST elevation, anterolateral leads No significant change since last tracing Confirmed by Michael Holland [phone]) on 05/20/2024 11:16:12 AM  Radiology UE VENOUS DUPLEX (7am - 7pm) Result Date: 05/20/2024 UPPER VENOUS STUDY  Patient Name:  Michael Holland  Date of Exam:   05/20/2024 Medical Rec #: [id number]        Accession #:    [id number] Date of Birth: 11-Aug-1961       Patient Gender: M Patient Age:   57 years Exam Location:  [facility] Procedure:      VAS US  UPPER EXTREMITY VENOUS DUPLEX Referring Phys: Michael Holland  --------------------------------------------------------------------------------  Indications: Swelling Risk Factors: None identified. Limitations: Poor ultrasound/tissue interface and body habitus. Comparison Study: No prior studies. Performing Technologist: Lerry Ransom RVT  Examination Guidelines: A complete evaluation includes B-mode imaging, spectral Doppler, color Doppler, and power Doppler as needed of all accessible portions of each vessel. Bilateral testing is considered an integral part of a complete examination. Limited examinations for reoccurring indications may be performed as noted.  Right Findings: +----------+------------+---------+-----------+----------+-------+ RIGHT     CompressiblePhasicitySpontaneousPropertiesSummary +----------+------------+---------+-----------+----------+-------+ IJV           Full       Yes  Yes                      +----------+------------+---------+-----------+----------+-------+ Subclavian               Yes       Yes                      +----------+------------+---------+-----------+----------+-------+ Axillary      Full       Yes       Yes                      +----------+------------+---------+-----------+----------+-------+ Brachial      Full                                          +----------+------------+---------+-----------+----------+-------+ Radial        Full                                          +----------+------------+---------+-----------+----------+-------+ Ulnar         Full                                          +----------+------------+---------+-----------+----------+-------+ Cephalic      Full                                          +----------+------------+---------+-----------+----------+-------+ Basilic       Full                                          +----------+------------+---------+-----------+----------+-------+  Left Findings:  +----------+------------+---------+-----------+----------+-------+ LEFT      CompressiblePhasicitySpontaneousPropertiesSummary +----------+------------+---------+-----------+----------+-------+ Subclavian               Yes       Yes                      +----------+------------+---------+-----------+----------+-------+  Summary:  Right: No evidence of deep vein thrombosis in the upper extremity. No evidence of superficial vein thrombosis in the upper extremity.  Left: No evidence of thrombosis in the subclavian.  *See table(s) above for measurements and observations.     Preliminary     Procedures Procedures    Medications Ordered in ED Medications  allopurinol (ZYLOPRIM) tablet 100 mg (has no administration in time range)  ketorolac  (TORADOL ) 30 MG/ML injection 15 mg (15 mg Intravenous Given 05/20/24 1228)  morphine (PF) 4 MG/ML injection 4 mg (4 mg Intravenous Given 05/20/24 1225)    ED Course/ Medical Decision Making/ A&P                                 Medical Decision Making Adult male with multiple medical issues including gout, recent circumcision, hypertension, CKD presents with fatigue, arm pain, he is awake, alert, afebrile, hemodynamically unremarkable, pulse ox 99% room air normal cardiac 80 sinus normal broad differential  including infection postop, pneumonia, nothing consistent with his physical exam, gout, anasarca.   Amount and/or Complexity of Data Reviewed Independent Historian:     Details: Urology notes Labs: ordered. Decision-making details documented in ED Course. Radiology: ordered and independent interpretation performed. Decision-making details documented in ED Course.  Risk Prescription drug management. Decision regarding hospitalization. Diagnosis or treatment significantly limited by social determinants of health.    Update: Patient markedly better following colchicine, labs reviewed, discussed, no evidence for substantial worsening of his  renal function, though he does have waxing, waning severity of his CKD.  No evidence for infection on his physical exam, given his improvement here patient will follow-up tomorrow with urology.        Final Clinical Impression(s) / ED Diagnoses Final diagnoses:  Weakness  Acute idiopathic gout of right hand    Rx / DC Orders ED Discharge Orders     None         Dorenda Gandy, MD 05/20/24 1420

## 2024-06-24 ENCOUNTER — Encounter: Payer: Medicare HMO | Admitting: Internal Medicine

## 2024-12-07 ENCOUNTER — Emergency Department (HOSPITAL_COMMUNITY)

## 2024-12-07 ENCOUNTER — Emergency Department (HOSPITAL_COMMUNITY)
Admission: EM | Admit: 2024-12-07 | Discharge: 2024-12-08 | Disposition: A | Discharge status: Home / Self Care | Attending: Emergency Medicine | Admitting: Emergency Medicine

## 2024-12-07 ENCOUNTER — Other Ambulatory Visit: Payer: Self-pay

## 2024-12-07 ENCOUNTER — Encounter (HOSPITAL_COMMUNITY): Payer: Self-pay

## 2024-12-07 DIAGNOSIS — I959 Hypotension, unspecified: Secondary | ICD-10-CM | POA: Diagnosis not present

## 2024-12-07 DIAGNOSIS — Z794 Long term (current) use of insulin: Secondary | ICD-10-CM | POA: Insufficient documentation

## 2024-12-07 DIAGNOSIS — Z79899 Other long term (current) drug therapy: Secondary | ICD-10-CM | POA: Insufficient documentation

## 2024-12-07 DIAGNOSIS — N189 Chronic kidney disease, unspecified: Secondary | ICD-10-CM | POA: Insufficient documentation

## 2024-12-07 DIAGNOSIS — Z7401 Bed confinement status: Secondary | ICD-10-CM | POA: Diagnosis not present

## 2024-12-07 DIAGNOSIS — M79606 Pain in leg, unspecified: Secondary | ICD-10-CM | POA: Diagnosis not present

## 2024-12-07 DIAGNOSIS — M5441 Lumbago with sciatica, right side: Secondary | ICD-10-CM | POA: Insufficient documentation

## 2024-12-07 DIAGNOSIS — I129 Hypertensive chronic kidney disease with stage 1 through stage 4 chronic kidney disease, or unspecified chronic kidney disease: Secondary | ICD-10-CM | POA: Insufficient documentation

## 2024-12-07 DIAGNOSIS — Z91040 Latex allergy status: Secondary | ICD-10-CM | POA: Insufficient documentation

## 2024-12-07 DIAGNOSIS — D72829 Elevated white blood cell count, unspecified: Secondary | ICD-10-CM | POA: Insufficient documentation

## 2024-12-07 DIAGNOSIS — M5442 Lumbago with sciatica, left side: Secondary | ICD-10-CM | POA: Insufficient documentation

## 2024-12-07 DIAGNOSIS — R262 Difficulty in walking, not elsewhere classified: Secondary | ICD-10-CM

## 2024-12-07 LAB — CBC WITH DIFFERENTIAL/PLATELET
Abs Immature Granulocytes: 0.07 K/uL (ref 0.00–0.07)
Basophils Absolute: 0 K/uL (ref 0.0–0.1)
Basophils Relative: 0 %
Eosinophils Absolute: 0 K/uL (ref 0.0–0.5)
Eosinophils Relative: 0 %
HCT: 41.6 % (ref 39.0–52.0)
Hemoglobin: 13.3 g/dL (ref 13.0–17.0)
Immature Granulocytes: 0 %
Lymphocytes Relative: 7 %
Lymphs Abs: 1.2 K/uL (ref 0.7–4.0)
MCH: 27.6 pg (ref 26.0–34.0)
MCHC: 32 g/dL (ref 30.0–36.0)
MCV: 86.3 fL (ref 80.0–100.0)
Monocytes Absolute: 1.5 K/uL — ABNORMAL HIGH (ref 0.1–1.0)
Monocytes Relative: 8 %
Neutro Abs: 15.3 K/uL — ABNORMAL HIGH (ref 1.7–7.7)
Neutrophils Relative %: 85 %
Platelets: 445 K/uL — ABNORMAL HIGH (ref 150–400)
RBC: 4.82 MIL/uL (ref 4.22–5.81)
RDW: 14.8 % (ref 11.5–15.5)
WBC: 18 K/uL — ABNORMAL HIGH (ref 4.0–10.5)
nRBC: 0 % (ref 0.0–0.2)

## 2024-12-07 LAB — BASIC METABOLIC PANEL WITH GFR
Anion gap: 8 (ref 5–15)
BUN: 26 mg/dL — ABNORMAL HIGH (ref 8–23)
CO2: 30 mmol/L (ref 22–32)
Calcium: 9.7 mg/dL (ref 8.9–10.3)
Chloride: 97 mmol/L — ABNORMAL LOW (ref 98–111)
Creatinine, Ser: 1.76 mg/dL — ABNORMAL HIGH (ref 0.61–1.24)
GFR, Estimated: 43 mL/min — ABNORMAL LOW
Glucose, Bld: 134 mg/dL — ABNORMAL HIGH (ref 70–99)
Potassium: 5.1 mmol/L (ref 3.5–5.1)
Sodium: 135 mmol/L (ref 135–145)

## 2024-12-07 MED ORDER — LORAZEPAM 2 MG/ML IJ SOLN
2.0000 mg | Freq: Once | INTRAMUSCULAR | Status: AC
  Administered 2024-12-07: 2 mg via INTRAVENOUS
  Filled 2024-12-07: qty 1

## 2024-12-07 MED ORDER — MORPHINE SULFATE (PF) 4 MG/ML IV SOLN
4.0000 mg | Freq: Once | INTRAVENOUS | Status: AC
  Administered 2024-12-07: 4 mg via INTRAVENOUS
  Filled 2024-12-07: qty 1

## 2024-12-07 MED ORDER — GADOBUTROL 1 MMOL/ML IV SOLN
10.0000 mL | Freq: Once | INTRAVENOUS | Status: AC | PRN
  Administered 2024-12-07: 10 mL via INTRAVENOUS

## 2024-12-07 MED ORDER — DIPHENHYDRAMINE HCL 50 MG/ML IJ SOLN
12.5000 mg | Freq: Once | INTRAMUSCULAR | Status: AC
  Administered 2024-12-07: 12.5 mg via INTRAVENOUS
  Filled 2024-12-07: qty 1

## 2024-12-07 MED ORDER — METHOCARBAMOL 500 MG PO TABS
500.0000 mg | ORAL_TABLET | Freq: Once | ORAL | Status: AC
  Administered 2024-12-07: 500 mg via ORAL
  Filled 2024-12-07: qty 1

## 2024-12-07 NOTE — ED Provider Notes (Signed)
 " [place] [facility]ERGENCY DEPARTMENT AT [facility] Provider Note   CSN: [id number] Arrival date & time: 12/07/24  1703     Patient presents with: Back Pain, Leg Pain, and Knee Pain   Michael Holland is a 62 y.o. male with a past medical history significant for hypertension, hyperlipidemia, CKD, chronic low back pain who presents to the ED due to inability to move his legs.  Patient states he has not been able to move his legs since 11/15.  He notes on 11/15 his son was moving his legs to perform lower extremity exercises and developed sudden onset of low back pain. Pain radiates down bilateral lower extremities to his knees. Has chronic low back pain.  Takes chronic Percocet.  Has had lumbar surgery in the past.  Had a remote injury.  No recent injury.  Denies fever and chills.  Patient notes he is able to transfer himself from the bed to a bedside commode.  Denies saddle anesthesia, bowel/bladder incontinence, lower extremity numbness/tingling.  He notes he is able to wiggle his toes however, unable to move his legs.  History obtained from patient and past medical records. No interpreter used during encounter.       Prior to Admission medications  Medication Sig Start Date End Date Taking? Authorizing Provider  allopurinol  (ZYLOPRIM ) 300 MG tablet Take 300 mg by mouth daily.    [provider]  amLODipine  (NORVASC ) 10 MG tablet Take 10 mg by mouth daily. 03/02/17   [provider]  atorvastatin (LIPITOR) 10 MG tablet Take 10 mg by mouth at bedtime. 11/30/23   [provider]  bisacodyl  (DULCOLAX) 5 MG EC tablet Take 5-10 mg by mouth daily as needed for moderate constipation.    [provider]  Celery Seed OIL Take 1 Dose by mouth daily.    [provider]  Cholecalciferol (VITAMIN D3 PO) Take 1-2 Doses by mouth daily as needed (Supplement).    [provider]  colchicine 0.6 MG tablet Take 0.6 mg by mouth daily as needed.  04/23/24   [provider]  Cyanocobalamin (B-12 PO) Take 1 tablet by mouth daily.    [provider]  Ferrous Sulfate (IRON PO) Take 1 tablet by mouth daily.    [provider]  ibuprofen  (ADVIL ,MOTRIN ) 200 MG tablet Take 2 tablets (400 mg total) by mouth every 8 (eight) hours as needed for headache (for pain.). May resume 5 days post-op as needed. 03/30/17   Bissell, Jaclyn M, PA-C  metaxalone (SKELAXIN) 800 MG tablet Take 800 mg by mouth 3 (three) times daily as needed for muscle spasms. 03/22/24   [provider]  metoprolol  (TOPROL -XL) 200 MG 24 hr tablet Take 200 mg by mouth daily. 03/03/17   [provider]  ondansetron  (ZOFRAN ) 4 MG tablet Take 1 tablet (4 mg total) by mouth 3-4 times daily as needed for nausea. 02/12/23     oxyCODONE -acetaminophen  (PERCOCET) 10-325 MG tablet Take 1-2 tablets by mouth every 4 (four) hours as needed for pain (severe). Patient taking differently: Take 1 tablet by mouth every 4 (four) hours as needed for pain. 03/29/17   Duwayne Purchase, MD  OZEMPIC , 2 MG/DOSE, 8 MG/3ML SOPN Inject 2 mg into the skin once a week. 02/20/24   [provider]  polyethylene glycol (MIRALAX  / GLYCOLAX ) packet Take 17 g by mouth daily. Patient not taking: Reported on 04/26/2024 03/29/17   Duwayne Purchase, MD  rosuvastatin (CRESTOR) 10 MG tablet Take 10 mg  by mouth daily. 02/19/24   [provider]  tiZANidine (ZANAFLEX) 2 MG tablet Take 2 mg by mouth at bedtime as needed for muscle spasms. 04/30/24   [provider]  valsartan -hydrochlorothiazide  (DIOVAN -HCT) 320-25 MG tablet Take 1 tablet by mouth daily. 03/02/17   [provider]    Allergies: Hydromorphone , Latex, and Other    Review of Systems  Constitutional:  Negative for fever.  Gastrointestinal:  Negative for abdominal pain.  Musculoskeletal:  Positive for back pain and gait problem.    Updated Vital Signs BP (!) 143/70   Pulse 84   Temp 98.2 F  (36.8 C) (Oral)   Resp 18   SpO2 98%   Physical Exam Vitals and nursing note reviewed.  Constitutional:      General: He is not in acute distress.    Appearance: He is not ill-appearing.  HENT:     Head: Normocephalic.  Eyes:     Pupils: Pupils are equal, round, and reactive to light.  Cardiovascular:     Rate and Rhythm: Normal rate and regular rhythm.     Pulses: Normal pulses.     Heart sounds: Normal heart sounds. No murmur heard.    No friction rub. No gallop.  Pulmonary:     Effort: Pulmonary effort is normal.     Breath sounds: Normal breath sounds.  Abdominal:     General: Abdomen is flat. There is no distension.     Palpations: Abdomen is soft.     Tenderness: There is no abdominal tenderness. There is no guarding or rebound.  Musculoskeletal:        General: Normal range of motion.     Cervical back: Neck supple.     Comments: Sensation intact to bilateral lower extremities. Patient will not let me lift his legs due to severe pain. Able to wiggle his toes.   Skin:    General: Skin is warm and dry.  Neurological:     General: No focal deficit present.     Mental Status: He is alert.  Psychiatric:        Mood and Affect: Mood normal.        Behavior: Behavior normal.     (all labs ordered are listed, but only abnormal results are displayed) Labs Reviewed  CBC WITH DIFFERENTIAL/PLATELET - Abnormal; Notable for the following components:      Result Value   WBC 18.0 (*)    Platelets 445 (*)    Neutro Abs 15.3 (*)    Monocytes Absolute 1.5 (*)    All other components within normal limits  BASIC METABOLIC PANEL WITH GFR - Abnormal; Notable for the following components:   Chloride 97 (*)    Glucose, Bld 134 (*)    BUN 26 (*)    Creatinine, Ser 1.76 (*)    GFR, Estimated 43 (*)    All other components within normal limits    EKG: None  Radiology: No results found.   Procedures   Medications Ordered in the ED  morphine  (PF) 4 MG/ML injection 4 mg  (has no administration in time range)  diphenhydrAMINE  (BENADRYL ) injection 12.5 mg (has no administration in time range)  methocarbamol  (ROBAXIN ) tablet 500 mg (500 mg Oral Given 12/07/24 1851)                                    Medical Decision Making Amount and/or Complexity  of Data Reviewed Independent Historian: spouse External Data Reviewed: notes. Labs: ordered. Decision-making details documented in ED Course. Radiology: ordered and independent interpretation performed. Decision-making details documented in ED Course.  Risk Prescription drug management.   This patient presents to the ED for concern of low back pain, this involves an extensive number of treatment options, and is a complaint that carries with it a high risk of complications and morbidity.  The differential diagnosis includes cauda equina, lumbar radiculopathy, infection, etc  63 year old male with history of chronic low back pain presents to the ED due to inability to move his lower extremities since 11/15.  Notes his son was moving his legs for lower extremity exercises and he developed worsening low back pain.  Had a remote low back injury.  Previous lumbar surgery.  Notes he has been in bed since 11/15.  No fever or chills.  Upon arrival patient afebrile, not tachycardic or hypoxic.  Patient in no acute distress.  Sensation intact to lower extremities bilaterally.  Able to move all toes. Patient will not let me lift up his legs and notes he does not want to hit me due to severe pain.  Patient will not let me perform a rectal exam to check rectal tone. MRI lumbar spine ordered. Routine labs. IV morphine  and robaxin  given.   CBC with leukocytosis at 18 likely due to recent steroids.  BMP with elevated creatinine at 1.7 and BUN at 26.  Which appears to be around patient's baseline.   Patient agreeable to transfer to [facility] for MRI L-spine. Discussed with Dr. Bari who accepts ED to ED transfer for MRI L spine.   Co  morbidities that complicate the patient evaluation  Chronic low back pain  Social Determinants of Health:  Chronic pain syndrome  Test / Admission - Considered:  Transfer to [facility] for MRI L-spine     Final diagnoses:  Acute bilateral low back pain with bilateral sciatica    ED Discharge Orders     None          Michael Holland, Michael Holland 12/07/24 1953    Michael Sid SAILOR, MD 12/07/24 2229  "

## 2024-12-07 NOTE — ED Provider Notes (Signed)
" °  Physical Exam  BP (!) 132/102 (BP Location: Left Arm)   Pulse 75   Temp 100.2 F (37.9 C) (Oral)   Resp 20   SpO2 100%   Physical Exam  Procedures  Procedures  ED Course / MDM    Medical Decision Making Amount and/or Complexity of Data Reviewed Labs: ordered. Radiology: ordered.  Risk Prescription drug management.   Patient transferred from [facility] for Lumbar MRI. History of chronic low back pain, reporting he has been unable to move his legs since 11/15. No sensory loss, extreme pain with attempt at passive movement. Noted to have WBC 18,000 in the setting of recent steroid use.   Appears uncomfortable on arrival. Ativan  ordered for patient comfort in obtaining MRI, additional morphine /benadryl  for pain. UA added. Patient with low grade fever.   Patient care signed out to [employer], PA-C, pending MRI and UA review.        Odell Balls, PA-C 12/07/24 2352  "

## 2024-12-07 NOTE — ED Notes (Signed)
 Pt arrived via [facility] from [facility]. Pt arrived to this department to receive an MRI.

## 2024-12-07 NOTE — ED Triage Notes (Signed)
 Pt BIB EMS from Home due to lower back and bilateral leg and knee pain for about 1 month. Pt report training legs with son when pt believe his spinal disc slipped. Hx of spinal disc slipped in past. Pt here since injury is not getting any better. Hx of chronic back and leg pain. Pt reports being ambulatory prior to injury, pt has been in bed for over a month due to injury and pain.

## 2024-12-07 NOTE — ED Notes (Signed)
 [facility] called for transport to [facility] ED for MRI.  [facility] accepting.  Consulting Civil Engineer at [facility] notified

## 2024-12-08 LAB — RESP PANEL BY RT-PCR (RSV, FLU A&B, COVID)  RVPGX2
Influenza A by PCR: NEGATIVE
Influenza B by PCR: NEGATIVE
Resp Syncytial Virus by PCR: NEGATIVE
SARS Coronavirus 2 by RT PCR: NEGATIVE

## 2024-12-08 LAB — URINALYSIS, ROUTINE W REFLEX MICROSCOPIC
Bilirubin Urine: NEGATIVE
Glucose, UA: NEGATIVE mg/dL
Hgb urine dipstick: NEGATIVE
Ketones, ur: NEGATIVE mg/dL
Leukocytes,Ua: NEGATIVE
Nitrite: NEGATIVE
Protein, ur: 30 mg/dL — AB
Specific Gravity, Urine: 1.019 (ref 1.005–1.030)
pH: 5 (ref 5.0–8.0)

## 2024-12-08 MED ORDER — MORPHINE SULFATE (PF) 4 MG/ML IV SOLN
4.0000 mg | Freq: Once | INTRAVENOUS | Status: AC
  Administered 2024-12-08: 4 mg via INTRAMUSCULAR
  Filled 2024-12-08: qty 1

## 2024-12-08 NOTE — Discharge Instructions (Addendum)
 Please follow-up with your primary doctor for outpatient management.  If any symptoms change or worsen, return to the nearest emergency department.

## 2024-12-08 NOTE — ED Provider Notes (Signed)
 Was informed by the social work team that patient now does not want to go to a skilled nursing facility and would like to go home.  I will cosign the DME orders and will order the face-to-face for them and plan for discharge as planned.  Patient will be discharged for outpatient follow-up    Tanya Marvin, Lonni PARAS, MD 12/08/24 1036

## 2024-12-08 NOTE — Care Management (Addendum)
 Transition of Care [facility]) - Emergency Department Mini Assessment   Patient Details  Name: Michael Holland MRN: [id number] Date of Birth: 07/28/1961  [facility] CM/SW Contact:    Corean JAYSON Canary, RN Phone Number: 12/08/2024, 10:18 AM   Clinical Narrative:  63 year old presented with back pain inability to walk. PT assessment revealed recommendation for SNF. Family discussed would like to go home with hospital bed with hoyer and wheelchair, as well as home health. They previously had [facility] and would ike them back. Reviewed with provider  DME and HH placed in HUB, provider will place [facility] orders.  1100 Spoke to [employer] rep. Discussed wheelchair to be brought to the ED then bed to be set up at home. They will call wife  68 Wheelchair has not arrived yet. Called [employer] to check on delivery time. They stated it is out for delivery and could potentially be there in 45 min to a hour. Team aware 1625 Reached out to [employer] again regarding DME that has not arrived, Could not get through at this time ED Mini Assessment: What brought you to the Emergency Department? : Back pain inability to walk  Barriers to Discharge: ED DME delivery (delivery of DME to home)        Interventions which prevented an admission or readmission: Home Health Consult or Services, DME Provided    Patient Contact and Communications        ,                 Admission diagnosis:  back/leg pain Patient Active Problem List   Diagnosis Date Noted   Status post gastric bypass for obesity 02/07/2019   Essential hypertension 02/07/2019   Vitamin D  deficiency 02/07/2019   Prediabetes 01/08/2019   Stage 3 chronic kidney disease (HCC) 01/08/2019   History of gastric surgery 12/25/2018   History of obstructive sleep apnea 12/25/2018   Lumbar spinal stenosis 03/30/2017   Spinal stenosis of lumbar region 03/29/2017   Spinal stenosis at L4-L5 level 03/29/2017   PCP:  [facility] Pharmacy:   [id number] [place], [place] - [address] AT [facility] [address] & [facility] [address] [place] [address] Phone: [phone] Fax: [phone]  [facility] [address], [place] - [address] [place] [address] Phone: [phone] Fax: [phone]

## 2024-12-08 NOTE — Progress Notes (Signed)
 CSW spoke with patient and wife at bedside. CSW went over the SNF recommendations. The patient and wife stated they would like to try home health at home. The patient stated he had home health before and they came out 3-4 times a week. The patient and wife couldn't remember the company that was coming to the home. The patient's wife stated that she will need a hospital bed, hoyer lift, and a wheelchair for discharge. The patient asked CSW if she could get him an mining engineer wheelchair. CSW notified case management of patient's request to discharge home with DME and home health.

## 2024-12-08 NOTE — Care Management (Signed)
" °  °  Durable Medical Equipment  (From admission, onward)           Start     Ordered   12/08/24 0959  For home use only DME standard manual wheelchair with seat cushion  Once       Comments: Patient suffers from back pain which impairs their ability to perform daily activities like toileting in the home.  A walker will not resolve issue with performing activities of daily living. A wheelchair will allow patient to safely perform daily activities. Patient can safely propel the wheelchair in the home or has a caregiver who can provide assistance. Length of need Lifetime. Accessories: elevating leg rests (ELRs), wheel locks, extensions and anti-tippers.   12/08/24 0958   12/08/24 0957  For home use only DME Hospital bed  Once       Question Answer Comment  Length of Need 12 Months   Patient has (list medical condition): Back pain,inability to ambulate DDD   The above medical condition requires: Patient requires the ability to reposition frequently   Head must be elevated greater than: 30 degrees   Bed type Semi-electric   Hoyer Lift Yes   Support Surface: Gel Overlay      12/08/24 0958            "

## 2024-12-08 NOTE — Evaluation (Signed)
 Physical Therapy Evaluation Patient Details Name: Michael Holland MRN: [id number] DOB: 1961/12/10 Today's Date: 12/08/2024  History of Present Illness  63 y.o. male presents to [facility] 12/07/24 due to inability to move LE's and sudden onset of low back pain. Back MRI showed mildly progressive moderate canal stenosis at L2-L3 and multilevel stenoses 2/2 degenerative changes and short pedicles. PMHx: hypertension, hyperlipidemia, CKD, chronic low back pain   Clinical Impression  Since 11/19, pt has been unable to stand with significant weakness in R>L LE's. Pt has been needing assist for all mobility to transfer to/from a removable arm BSC. Pt has also needed assist for all ADLs. Pt was able to move into long-sitting with use of bilateral handrails and TotalA to maintain position. Declined OOB mobility this session with agreement to attempt once in hospital bed. Pt currently has 24/7 assist for a short period of time until his son returns to college. Intermittent assist then available from wife. Recommending <3hrs post acute rehab to maximize rehab potential and reduce caregiver burden. Acute PT to follow.         If plan is discharge home, recommend the following: A lot of help with walking and/or transfers;A lot of help with bathing/dressing/bathroom;Assist for transportation;Help with stairs or ramp for entrance   Can travel by private vehicle   NoNo    Equipment Recommendations Wheelchair (measurements PT);Wheelchair cushion (measurements PT);Hospital bed;Hoyer lift     Functional Status Assessment Patient has had a recent decline in their functional status and demonstrates the ability to make significant improvements in function in a reasonable and predictable amount of time.     Precautions / Restrictions Precautions Precautions: Fall Recall of Precautions/Restrictions: Intact Restrictions Weight Bearing Restrictions Per Provider Order: No      Mobility  Bed Mobility Overal bed  mobility: Needs Assistance Bed Mobility: Supine to Sit    Supine to sit: Total assist, Used rails    General bed mobility comments: declined OOB mobility with pt pulling on bilateral rails to move into long-sitting. Unable to maintain position without TotalA    Transfers  General transfer comment: pt declined         Pertinent Vitals/Pain Pain Assessment Pain Assessment: Faces Faces Pain Scale: Hurts even more Pain Location: L>R LE at rest and with movement Pain Descriptors / Indicators: Aching, Discomfort, Grimacing, Guarding Pain Intervention(s): Limited activity within patient's tolerance, Monitored during session, Repositioned    Home Living Family/patient expects to be discharged to:: Private residence Living Arrangements: Spouse/significant other Available Help at Discharge: Family;Available 24 hours/day;Available PRN/intermittently (24/7 until son goes back to college after Christmas break) Type of Home: House Home Access: Stairs to enter   [employer] of Steps: 1 (lip into back door)   Home Layout: Two level;1/2 bath on main level Home Equipment: Rolling Walker (2 wheels);BSC/3in1;Lift chair (removable arm BSC)      Prior Function Prior Level of Function : Needs assist    Mobility Comments: Has been unable to stand since 11/19. Assist from family to lateral scoot to [employer]. ADLs Comments: Assist for bathing, dressing, and pericare     Extremity/Trunk Assessment   Upper Extremity Assessment Upper Extremity Assessment: Defer to OT evaluation    Lower Extremity Assessment Lower Extremity Assessment: RLE deficits/detail;LLE deficits/detail RLE Deficits / Details: Less ankle DF/PF ROM when compared to L ankle. Severe pain with slight knee/hip flexion. Ankle DF 4/5, PF 3+/5, fair quad contraction RLE Sensation: WNL LLE Deficits / Details: Limited ankle DF/PF ROM with severe  pain with slight knee/hip flexion. Ankle DF 4/5, PF 3+/5, fair quad  contraction LLE Sensation: WNL       Communication   Communication Communication: No apparent difficulties    Cognition Arousal: Alert Behavior During Therapy: WFL for tasks assessed/performed   PT - Cognitive impairments: No apparent impairments    Following commands: Intact       Cueing Cueing Techniques: Verbal cues, Tactile cues     General Comments General comments (skin integrity, edema, etc.): VSS on RA     PT Assessment Patient needs continued PT services  PT Problem List Decreased strength;Decreased activity tolerance;Decreased range of motion;Decreased balance;Decreased mobility       PT Treatment Interventions DME instruction;Gait training;Functional mobility training;Therapeutic activities;Therapeutic exercise;Balance training;Neuromuscular re-education;Patient/family education;Wheelchair mobility training    PT Goals (Current goals can be found in the Care Plan section)  Acute Rehab PT Goals Patient Stated Goal: to regain movement in his legs PT Goal Formulation: With patient/family Time For Goal Achievement: 12/22/24 Potential to Achieve Goals: Good    Frequency Min 2X/week        AM-PAC PT 6 Clicks Mobility  Outcome Measure Help needed turning from your back to your side while in a flat bed without using bedrails?: Total Help needed moving from lying on your back to sitting on the side of a flat bed without using bedrails?: Total Help needed moving to and from a bed to a chair (including a wheelchair)?: Total Help needed standing up from a chair using your arms (e.g., wheelchair or bedside chair)?: Total Help needed to walk in hospital room?: Total Help needed climbing 3-5 steps with a railing? : Total 6 Click Score: 6    End of Session   Activity Tolerance: Patient limited by pain Patient left: in bed;with call bell/phone within reach;with family/visitor present Nurse Communication: Mobility status PT Visit Diagnosis: Unsteadiness on feet  (R26.81);Other abnormalities of gait and mobility (R26.89);Muscle weakness (generalized) (M62.81)    Time: 9199-9173 PT Time Calculation (min) (ACUTE ONLY): 26 min   Charges:   PT Evaluation $PT Eval Moderate Complexity: 1 Mod   PT General Charges $$ ACUTE PT VISIT: 1 Visit       Kate ORN, PT, DPT Secure Chat Preferred  Rehab Office [phone]   Kate BRAVO Wendolyn 12/08/2024, 8:49 AM

## 2024-12-08 NOTE — ED Notes (Addendum)
Eating breakfast 

## 2024-12-08 NOTE — ED Provider Notes (Signed)
 Discussed MRI results with patient and with Dr. Darra.    Patient still can't walk despite multiple meds.   Needing major assistance with ADLs.  Will consult TOC and have case management see the patient along with PT/OT.  Patient moved to boarder status.   Vicky Charleston, PA-C 12/08/24 9446    Darra Fonda MATSU, MD 12/08/24 [phone]

## 2024-12-08 NOTE — ED Notes (Signed)
 Pt home wheel chair delivered, pt required 4 staff members to assist pt into wheelchair. Pt unable to lift legs at all. Pt advised to consider options offered such as SNF placement. Pt refused. Pt disregarded warning about fall risk insist on going home. Pt questioned about how he plans to tx to vehicle. Pt refused to answer. Ed tech push pt to front door.
